# Patient Record
Sex: Male | Born: 1945 | Race: White | Hispanic: No | Marital: Married | Smoking: Former smoker
Health system: Southern US, Community
[De-identification: ages and names within clinical notes are randomized; demographics above are authoritative.]

## PROBLEM LIST (undated history)

## (undated) DIAGNOSIS — I2510 Atherosclerotic heart disease of native coronary artery without angina pectoris: Secondary | ICD-10-CM

## (undated) DIAGNOSIS — E785 Hyperlipidemia, unspecified: Secondary | ICD-10-CM

## (undated) DIAGNOSIS — K08109 Complete loss of teeth, unspecified cause, unspecified class: Secondary | ICD-10-CM

## (undated) DIAGNOSIS — L719 Rosacea, unspecified: Secondary | ICD-10-CM

## (undated) DIAGNOSIS — M1990 Unspecified osteoarthritis, unspecified site: Secondary | ICD-10-CM

## (undated) DIAGNOSIS — J302 Other seasonal allergic rhinitis: Secondary | ICD-10-CM

## (undated) DIAGNOSIS — G609 Hereditary and idiopathic neuropathy, unspecified: Secondary | ICD-10-CM

## (undated) DIAGNOSIS — M795 Residual foreign body in soft tissue: Secondary | ICD-10-CM

## (undated) DIAGNOSIS — Z87442 Personal history of urinary calculi: Secondary | ICD-10-CM

## (undated) DIAGNOSIS — Z973 Presence of spectacles and contact lenses: Secondary | ICD-10-CM

## (undated) DIAGNOSIS — I700 Atherosclerosis of aorta: Secondary | ICD-10-CM

## (undated) DIAGNOSIS — E039 Hypothyroidism, unspecified: Secondary | ICD-10-CM

## (undated) DIAGNOSIS — Z972 Presence of dental prosthetic device (complete) (partial): Secondary | ICD-10-CM

## (undated) HISTORY — PX: TOTAL HIP ARTHROPLASTY: SHX124

## (undated) HISTORY — DX: Unspecified osteoarthritis, unspecified site: M19.90

## (undated) HISTORY — PX: CARDIOVASCULAR STRESS TEST: SHX262

## (undated) HISTORY — PX: LAPAROSCOPIC APPENDECTOMY: SUR753

---

## 1999-09-17 ENCOUNTER — Encounter: Payer: Self-pay | Admitting: Emergency Medicine

## 1999-09-17 ENCOUNTER — Emergency Department (HOSPITAL_COMMUNITY): Admission: EM | Admit: 1999-09-17 | Discharge: 1999-09-17 | Payer: Self-pay | Admitting: Emergency Medicine

## 1999-09-24 ENCOUNTER — Ambulatory Visit (HOSPITAL_COMMUNITY): Admission: RE | Admit: 1999-09-24 | Discharge: 1999-09-24 | Payer: Self-pay | Admitting: Orthopedic Surgery

## 1999-09-24 ENCOUNTER — Encounter: Payer: Self-pay | Admitting: Orthopedic Surgery

## 2002-03-26 ENCOUNTER — Emergency Department (HOSPITAL_COMMUNITY): Admission: EM | Admit: 2002-03-26 | Discharge: 2002-03-27 | Payer: Self-pay | Admitting: Emergency Medicine

## 2002-03-27 ENCOUNTER — Encounter: Payer: Self-pay | Admitting: Emergency Medicine

## 2002-10-07 ENCOUNTER — Ambulatory Visit (HOSPITAL_COMMUNITY): Admission: RE | Admit: 2002-10-07 | Discharge: 2002-10-07 | Payer: Self-pay | Admitting: Gastroenterology

## 2002-10-07 ENCOUNTER — Encounter (INDEPENDENT_AMBULATORY_CARE_PROVIDER_SITE_OTHER): Payer: Self-pay | Admitting: Specialist

## 2004-05-17 ENCOUNTER — Inpatient Hospital Stay (HOSPITAL_COMMUNITY): Admission: RE | Admit: 2004-05-17 | Discharge: 2004-05-20 | Payer: Self-pay | Admitting: Orthopedic Surgery

## 2006-04-04 ENCOUNTER — Inpatient Hospital Stay (HOSPITAL_COMMUNITY): Admission: EM | Admit: 2006-04-04 | Discharge: 2006-04-05 | Payer: Self-pay | Admitting: Emergency Medicine

## 2006-04-04 ENCOUNTER — Encounter (INDEPENDENT_AMBULATORY_CARE_PROVIDER_SITE_OTHER): Payer: Self-pay | Admitting: Specialist

## 2006-08-12 ENCOUNTER — Emergency Department (HOSPITAL_COMMUNITY): Admission: EM | Admit: 2006-08-12 | Discharge: 2006-08-12 | Payer: Self-pay | Admitting: Emergency Medicine

## 2006-08-27 ENCOUNTER — Ambulatory Visit: Payer: Self-pay | Admitting: Cardiovascular Disease

## 2006-09-03 ENCOUNTER — Ambulatory Visit: Payer: Self-pay

## 2008-10-14 ENCOUNTER — Ambulatory Visit: Payer: Self-pay | Admitting: Cardiovascular Disease

## 2008-10-21 ENCOUNTER — Ambulatory Visit: Payer: Self-pay

## 2009-07-06 ENCOUNTER — Telehealth (INDEPENDENT_AMBULATORY_CARE_PROVIDER_SITE_OTHER): Payer: Self-pay | Admitting: *Deleted

## 2010-10-03 ENCOUNTER — Encounter: Admission: RE | Admit: 2010-10-03 | Discharge: 2010-10-03 | Payer: Self-pay | Admitting: Family Medicine

## 2011-05-01 NOTE — Assessment & Plan Note (Signed)
[facility]                            CARDIOLOGY OFFICE NOTE   NAME:Ryan Kerr, Ryan Kerr                   MRN:          [id number]  DATE:10/14/2008                            DOB:          1946/11/08    A 65 year old patient referred for chest pain, hyperlipidemia, and  smoking.   The patient has been seen in our practice back in 2007.  At that time,  he had atypical chest pain and had a normal stress Myoview.   Over the last few weeks, the patient has had continued atypical chest  pain.  The pain is central in his chest.  It does not necessarily  radiate.  It is a dull ache, nothing he does makes it go away quicker,  nothing he does makes it worse, in particular it is nonexertional.  There is no associated diaphoresis.  The pain is intermittent during the  day, in fact while I was sitting and talking to him, he said he had  pain.   The pain is clearly not positional.  It has not been progressive, but it  has been persistent.   His coronary risk factors include hypercholesterolemia and smoking.  He  has been on statin drugs for a while.   I do not have a recent LDL on him.  He smokes about a pack a day.  He  has not tried to quit.  I talked to him at length regarding alternatives  in terms of Wellbutrin, Chantix, nicotine patch.   The patient mostly smokes when he is doing activities outdoors.   The long-term risk in regards to lung cancer and vascular disease were  discussed with him.   His review of systems is otherwise negative and particularly he has not  had signs of reflux or gallbladder disease.   He has no known allergies.   His meds include simvastatin 40 mg a day, an aspirin a day, calcium.   Family history is unremarkable.  He has no allergies.   His past medical history is otherwise remarkable for previous left hip  replacement and appendectomy.   He does drink alcohol possibly in excess.  He has couple caffeinated  beverages a day.   The patient is married.  He has no kids.  He continues to be more  sedentary than previously.  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   He also fought in [place].   PHYSICAL EXAMINATION:  GENERAL:  His exam is remarkable for a large-  chested white male in no distress.  VITAL SIGNS:  His blood pressure 120/82, pulse 80 and regular,  respiratory rate 14, afebrile, his weight has gone up in 2 years from  188-196.  He is in sinus rhythm, rate of 70.  HEENT:  Unremarkable.  NECK:  Carotids normal without bruit.  No lymphadenopathy, thyromegaly,  or JVP elevation.  LUNGS:  Clear, good diaphragmatic motion.  No wheezing.  CARDIAC:  S1-S2 with normal heart sounds.  PMI normal.  Chest is not  tender to touch.  ABDOMEN:  Benign.  Bowel sounds positive.  No AAA, no tenderness, no  bruit, no hepatosplenomegaly, no hepatojugular reflux or tenderness.  EXTREMITIES:  Distal pulse are intact.  No edema.  NEURO:  Nonfocal.  SKIN:  Warm and dry.  MUSCULOSKELETAL:  No weakness.   EKG shows sinus rhythm.  There is nonspecific ST-T wave changes in the  inferior leads.   IMPRESSION:  1. Atypical chest pain, nonspecific EKG changes.  Followup stress      Myoview.  2. Hyperlipidemia.  Continue statin drug.  Lipid and liver profile in      6 months.  3. Smoking cessation.  Discussed with Dr. Caryl Never need to start      Wellbutrin or nicotine patch.   Overall, I think the patient's symptoms are atypical, as long as his  stress Myoview is normal, we will see him on an as-needed basis.     [name]. [name], MD, [facility]  Electronically Signed    PCN/MedQ  DD: 10/14/2008  DT: 10/14/2008  Job #: [id number]   cc:   Ursula Beath, MD  [name], M.D.

## 2011-05-04 NOTE — Discharge Summary (Signed)
NAME:  Ryan Kerr, Ryan Kerr NO.:  [id number]   MEDICAL RECORD NO.:  [id number]                   PATIENT TYPE:  INP   LOCATION:  5001                                 FACILITY:  [facility]   PHYSICIAN:  Dyke Brackett, M.D.                 DATE OF BIRTH:  1946/02/08   DATE OF ADMISSION:  05/17/2004  DATE OF DISCHARGE:  05/20/2004                                 DISCHARGE SUMMARY   ADMISSION DIAGNOSES:  1. 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  2. Hypercholesterolemia.   DISCHARGE DIAGNOSES:  1. Left total hip arthroplasty.  2. Asymptomatic postoperative blood loss anemia.  3. Hypercholesterolemia.   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  Since that time he has had progressively worsening  pain in the hip with loss of range of motion with any type of activity.  He  does describe a sharp stabbing-type pain throughout the lateral aspect of  the hip that radiates down the entire leg, occasionally has popping and  catching.  X-rays reveal a deformed femoral head with bone on bone,  acetabulum with spurring.   ALLERGIES:  No known drug allergies.   CURRENT MEDICATIONS:  Simvastatin 40 mg p.o. daily.   SURGICAL PROCEDURE:  On May 17, 2004, the patient was taken to the OR by Dyke Brackett, M.D., assisted by Richardean Canal, P.A.-C.  Under general  anesthesia the patient underwent a left total hip arthroplasty with a 15 mm  large-stature femoral stem, a 52 mm outside diameter, 32 mm inside diameter  acetabular lining, a size 52 mm 100 Series acetabular cup, a 32 mm +9  femoral ball on an apex hole eliminator.  The patient tolerated the  procedure well.  There were no complications.  The patient was discharged to  the recovery room and then to the orthopedic floor in good condition.   CONSULTATIONS:  The following  routine consults were requested:  Physical  therapy and occupational therapy.   HOSPITAL COURSE:  On May 17, 2004, the patient was admitted to [facility] under the care of Dr. Frederico Hamman.  He underwent a left total  hip arthroplasty without any complications.  The patient tolerated the  procedure well.  The patient was transferred to the recovery room and then  to the orthopedic floor in good condition for a routine total hip protocol  and Lovenox for DVT prophylaxis.  The patient incurred a three-day  postoperative course in which the patient did very well with physical  therapy with vital signs remaining stable.  His leg remained  neuromotorvascularly intact.  His wound remained benign.  He did develop  some postoperative blood loss anemia with his hemoglobin dropping to 9.5,  but the patient otherwise remained asymptomatic  so this was allowed to  resolve on its own.  The patient's pain was initially controlled with IV  pain medications, but transition over to p.o. medications went very well  without any problems, and the patient was felt to be medically and  orthopedically stable, ready for discharge home on postop day #3 in good  condition, with routine home health outpatient protocol.   LABORATORY DATA:  EKG on admission was normal sinus rhythm at 65 beats per  minute.  A CBC done on June 4:  WBC 10.7, hemoglobin 9.7, hematocrit 28.1,  platelets 186.  Routine chemistries on July 3:  Sodium of 133, up from a low  of 130 the day previous, glucose 117, felt to be routine postop stress and  inactivity, would continue to monitor.  BUN 11, creatinine 1.0, calcium of  7.7.  Routine urinalysis on admission was normal.   MEDICATIONS UPON DISCHARGE FROM ORTHOPEDICS FLOOR:  1. Zocor 40 mg p.o. daily.  2. Colace 100 mg p.o. b.i.d.  3. Trinsicon one tablet p.o. t.i.d.  4. Lovenox 40 mg subcu q.24h.  5. Percocet one or two tablets every four to six hours p.r.n. pain.  6. Tylenol  650 mg p.o. q.4h. p.r.n.  7. Skelaxin one or two tablets every six to eight hours p.r.n.  8. Restoril 15 mg p.o. q.h.s. p.r.n.   DISCHARGE INSTRUCTIONS:  1. Medications:  The patient is to resume routine home medications with the     addition of the following:     a. Percocet one or two tablets every four to six hours for pain as        needed.     b. Lovenox 40 mg injection once a day for the next 10 days.  2. Activity as tolerated with the use of a walker.  The patient is to     closely monitor hip precautions.  3. Diet:  No restrictions.  4. Wound care:  The patient is to keep wound clean.  If any concerns of     infections, the patient is to call Dr. Candise Bowens office for advise.  5. Follow-up:  The patient is to call [phone] for a follow-up appointment     with Dr. Madelon Lips in approximately 14 days.   CONDITION ON DISCHARGE TO HOME:  Improved and good.      Jamelle Rushing, Arnetha Courser, M.D.    RWK/MEDQ  D:  06/20/2004  T:  06/20/2004  Job:  [id number]   cc:   [facility]

## 2011-05-04 NOTE — Assessment & Plan Note (Signed)
[name]                              CARDIOLOGY OFFICE NOTE   NAME:Ryan Kerr                   MRN:          [id number]  DATE:08/27/2006                            DOB:          [birth year]    Mr. 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  He is referred for chest pain.  He was seen in  the Emergency Room at [facility] on August 29.  His chest pain has been  going on for a few weeks.   He was seen there and had a normal EKG, ruled out for myocardial infarction,  and was discharged from the ER.  He has not had a stress test.   He has hyperlipidemia and smokes.   Otherwise, his past medical history is fairly unremarkable.   He does not do illicit drugs.  He currently smokes half a pack a day.  He  used to drink quite a bit, but does not drink at all now.  He has one  caffeinated beverage a day.  His pain actually sounds more musculoskeletal  in nature.  He gets a numbness and tingling down his left arm.  He does  occasionally get chest pressure that radiates towards his abdomen.  There is  no history of reflux or ulcer disease.   He has had a previous left hip replacement and appendectomy.   He is retired.  He was in the [employer]	[place].  He was in [place] and stationed  all over the world.  He particularly enjoyed [place] and the [employer].   He is married, does not have kids.  He used to swim, but has not done this  in a month.   He denies any history of cervical spine problems or carpal tunnel.  He used  to work at the post office, and is right-handed.   He takes Lipitor 40 mg a day.   He is not allergic to anything.   REVIEW OF SYSTEMS:  Remarkable for no previous cervical spine problems, no  disk problems, no history of thyroid disease and no previous coronary  disease.   PHYSICAL EXAMINATION:  The blood pressure is 120/70, pulse is 70 and  regular.  LUNGS:   Clear.  CAROTIDS:  Normal.  There is an S1 and S2, normal heart sounds.  ABDOMEN:  Benign.  EXTREMITIES:  Intact pulses, no edema.  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  I did multiple muscular maneuvers to test at his neck and  shoulder girdle.  He had no significant pain or weakness in his upper  extremities, and no evidence of range of motion problems with his cervical  spine.  No loss of pulse with prolonged arm raises.   Baseline EKG is normal.   IMPRESSION:  I do not think that the patient's pain is cardiac in etiology.  He had no EKG changes with pain in the Emergency Room, and ruled out for  myocardial infarction.  I think that he does deserve, given his risk factors  and age, to have a  stress Myoview study.  Apparently some of this pain may  have been precipitated by a friend who had a recent stent.  I think as long  as his stress Myoview is normal he can follow up with Ryan Kerr, and  possibly have further workup for either rotator cuff or cervical spine  problem.   I told him it would be fine for him to return to swimming.  In the meantime  I told him to try anti-inflammatory medicine such as Motrin for the pain.  We will try to get his stress Myoview scheduled as soon as possible.                               Ryan Pick. [name], MD, [facility]    PCN/MedQ  DD:  08/27/2006  DT:  08/28/2006  Job #:  [id number]   cc:   Ryan Peat, M.D.  Ryan Kerr

## 2011-05-04 NOTE — H&P (Signed)
NAME:  Ryan Kerr, Ryan Kerr NO.:  [id number]   MEDICAL RECORD NO.:  [id number]                   PATIENT TYPE:  INP   LOCATION:  NA                                   FACILITY:  [facility]   PHYSICIAN:  Jamelle Rushing, P.A.                DATE OF BIRTH:  1946/09/15   DATE OF ADMISSION:  05/17/2004  DATE OF DISCHARGE:                                HISTORY & PHYSICAL   CHIEF COMPLAINT:  Pain and loss of range of motion of left hip.   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  Since that time, he has been having  progressively worsened pain and loss of range of motion in that hip; it  worsens with any type of activity and the amount of time he stands on his  feet.  He does note a sharp, stabbing-type pain throughout the lateral  aspect of the hip; it does radiate down the entire leg.  He does have  occasional popping and catching.  X-rays reveal a deformed femoral head with  bone-on-bone of the acetabulum with spurring.   ALLERGIES:  No known drug allergies.   CURRENT MEDICATIONS:  Simvastatin 40 mg p.o. daily.   PREVIOUS MEDICAL HISTORY:  Hypercholesterolemia.   PAST SURGICAL HISTORY:  Right knee meniscectomy in 1983 with no  complications.   SOCIAL HISTORY:  The patient is a 65 year old white male, fairly physically  fit-appearing.  He does have a history of smoking about 3/4 of a pack over  the last 37 years on a daily basis.  He does not drink.  He is married; he  lives with his wife in a 1-story house; he is currently retired.   FAMILY PHYSICIAN:  [facility].   FAMILY MEDICAL HISTORY:  Mother is deceased from possible cardiac  complications.  Father is alive with diabetes.  The patient has got 4  brothers alive with no medical issues and 1 sister alive with no medical  complaints.   REVIEW OF  SYSTEMS:  Review of systems is positive for full upper dentures,  partial lower plate.  He does have reading glasses.  Otherwise, all other  review of system categories are negative.   PHYSICAL EXAM:  VITALS:  Height is 5 foot 4 inches.  Weight is 170 pounds.  Blood pressure is 98/62, pulse is 72, respirations are 12, patient is  afebrile.  GENERAL:  This is a short-in-stature, fairly healthy-appearing, physically  fit male.  He is able to ambulate without any significant limp.  He is able  to get himself easily on and off the exam table.  HEENT:  Head was normocephalic.  Pupils equal, round and reactive,  accommodating to light.  Extraocular motions intact.  Sclerae are  nonicteric.  External ears are without deformities.  Gross hearing is  intact.  Nasal septum is midline.  Oral buccal mucosa is pink and moist.  NECK:  Neck was supple with no palpable lymphadenopathy.  Thyroid region was  nontender.  He had good range of motion of his cervical spine without any  difficulty or tenderness.  CHEST:  Lung sounds were clear and equal bilaterally, no wheezes, rales or  rhonchi.  HEART:  Regular rate and rhythm.  ABDOMEN:  Abdomen was soft and nontender.  Bowel sounds were normoactive  throughout.  No hepatosplenomegaly.  EXTREMITIES:  Upper extremities were symmetrically sized and shaped.  He had  good range of motion of shoulders, elbows and wrists.  Motor strength was  5/5.  Lower extremities:  Right and left hips had full extension.  Right  hip, he was able to bring up to 120 degrees, left hip up to 100 degrees.  He, on the left hip, had 0-degrees internal rotation and 5-degrees external.  Right hip had 20-degrees internal and external rotation without any  difficulty.  Bilateral knees were symmetrical with good range of motion, no  instability, no signs of erythema or ecchymosis or effusion.  Calves were  nontender.  Ankles were symmetrical with good dorsi/plantarflexion.  PERIPHERAL  VASCULATURE:  Carotid pulses were 2+, no bruits.  Radial pulses  were 2+.  Dorsalis pedis and posterior tibial pulses were 2+.  He had no  lower extremity edema.  NEUROLOGIC:  The patient was conscious, alert and appropriate, easy  conversation with examiner.  Cranial nerves II-XII were grossly intact.  The  patient was grossly intact to light-touch sensation from head to toe, no  gross neurologic defects noted.  BREAST, RECTAL AND GU EXAMS:  Deferred at this time.   IMPRESSION:  1. Traumatic end-stage osteoarthritis of left hip.  2. Hypercholesterolemia.   PLAN:  The patient will be admitted to [facility] on May 17, 2004  for a planned left total hip arthroplasty by Dr. Lacretia Nicks. Dava Najjar.  The patient  will undergo all routine labs and tests for this procedure.  The patient  does have 2 units of autologous blood for this procedure.                                                Jamelle Rushing, P.A.    RWK/MEDQ  D:  05/10/2004  T:  05/11/2004  Job:  [id number]   cc:   Thera Flake., M.D.  [address] [place]  [place] [address]  Fax: [phone]

## 2011-05-04 NOTE — Op Note (Signed)
NAME:  Ryan Kerr, Ryan Kerr NO.:  [id number]   MEDICAL RECORD NO.:  [id number]                   PATIENT TYPE:  INP   LOCATION:  2899                                 FACILITY:  [facility]   PHYSICIAN:  Thera Flake., M.D.             DATE OF BIRTH:  1946/01/03   DATE OF PROCEDURE:  05/17/2004  DATE OF DISCHARGE:                                 OPERATIVE REPORT   PREOPERATIVE DIAGNOSIS:  Status post work-related injury with  osteoarthritis, left hip.   POSTOPERATIVE DIAGNOSIS:  Status post work-related injury with  osteoarthritis, left hip.   OPERATION:  Left total hip replacement (DePuy Prodigy stem with 32 mm hip  ball, +9 mm neck length, and 52 mm acetabulum).   SURGEON:  Dyke Brackett, M.D.   ASSISTANTChestine Spore, P.A.   ESTIMATED BLOOD LOSS:  Approximately 250.   General anesthetic.   Sterile prep and drape in the lateral position.  A posterior approach to the  hip made with careful identification of the sciatic nerve.  The iliotibial  band was split.  The short external rotators of the hip split, revealing a  moderately enlarged head with severe degenerative change.  The head was cut  about one finger breadth above the lesser trochanter, followed by  progressive reaming and rasping to a 14.5 mm size stem to accept a 15 mm  stem.  The acetabulum was identified and the excess labral tissue was  debrided.  A moderate capsular release made.  Reaming of the acetabulum was  progressively made up to a 51 to accept a 52 mm cup with the appropriate  amount of anteversion and abduction.  Final components were inserted into a  good bleeding bone bed, followed by insertion of a trial poly.  Rasp was  used as a trial with the best restoration of leg length/stability with a  Prodigy stem and a +9 mm neck length.  The final components were inserted,  acetabular cup followed by femur, and again a neck trial done off that and  again confirmed to be a +9 mm neck  length in terms of optimal stability.  The closure was effected on the iliotibial band fascia with running #1  Ethibond, 2-0 Vicryl, and skin clips.  Marcaine with epinephrine was  infiltrated in the skin, a lightly compressive sterile dressing applied.                                               Thera Flake., M.D.    WDC/MEDQ  D:  05/17/2004  T:  05/17/2004  Job:  [id number]

## 2011-05-04 NOTE — Op Note (Signed)
NAME:  Ryan Kerr, Ryan Kerr NO.:  [id number]   MEDICAL RECORD NO.:  [id number]          PATIENT TYPE:  INP   LOCATION:  0098                         FACILITY:  [facility]   PHYSICIAN:  Wilmon Arms. Corliss Skains, M.D. DATE OF BIRTH:  [birth year]   DATE OF PROCEDURE:  04/04/2006  DATE OF DISCHARGE:                                 OPERATIVE REPORT   PREOP DIAGNOSIS:  Acute appendicitis.   POSTOPERATIVE DIAGNOSIS:  Acute appendicitis.   PROCEDURE PERFORMED:  Laparoscopic appendectomy.   SURGEON:  Wilmon Arms. Tsuei, M.D.   ANESTHESIA:  General endotracheal.   INDICATIONS:  The patient is a 65 year old male who presents with several  days of generalized abdominal pain, now localized to his right lower  quadrant. He denies any fever, but he has had a decreased appetite. The  patient did have some fever a couple of days ago. He last ate yesterday. He  went to go see his family physician today; and the patient was sent for CT  scan which showed evidence of right lower quadrant inflammation, consistent  with appendicitis. He was then referred to hospital for surgical evaluation.   DESCRIPTION OF PROCEDURE:  The patient was brought to the operating and  placed in the supine position on the operating table. After an adequate  level of general endotracheal anesthesia was obtained, a Foley catheter was  placed under sterile technique. The patient's abdomen was then shaved,  prepped with Betadine, and draped in a sterile fashion. A transverse  incision was made just above the umbilicus. Dissection was carried down to  the fascia which was opened vertically. A pursestring suture of Vicryl was  placed circumferentially around the fascial opening.   The Hasson cannula was inserted in the peritoneum and was secured with a  stay suture. Pneumoperitoneum was obtained by insufflating CO2 and  maintaining a maximum pressure of 15 mmHg. The laparoscopy was inserted and  no purulence was noted in the  right lower quadrant. A 10-mm port was placed  in the upper midline. A 5-mm port was placed in the left lower quadrant. The  scope was moved to the upper midline port. Two Glassman clamps were inserted  through the other work ports and the cecum was then retracted medially; and  an inflamed nonperforated appendix was immediately encountered. It was  grasped with a Babcock clamp and elevated.  A  Harmonic scalpel was used to  take down the mesoappendix, down to the base of the appendix. An Endo-GIA  stapler was used to transect the base of the appendix. The appendix was  placed in an EndoCatch sac and removed through the umbilical port. We  reinspected the staple line which was noted to be hemostatic.   The right lower quadrant was then thoroughly irrigated. No bleeding was  noted. The appendix was removed through the umbilical incision. The ports  were removed under direct vision as pneumoperitoneum was released. The  patient  was then rolled back to a flat position. The wounds were closed with 4-0  Monocryl. Steri-Strips and clean dressings were applied. The patient was  extubated, and brought to the  recovery room in stable condition. His Foley  catheter was removed prior to leaving the operating room.      Wilmon Arms. Tsuei, M.D.  Electronically Signed     MKT/MEDQ  D:  04/04/2006  T:  04/05/2006  Job:  [id number]

## 2013-03-27 LAB — HM COLONOSCOPY

## 2014-10-08 ENCOUNTER — Emergency Department (HOSPITAL_COMMUNITY): Payer: Federal, State, Local not specified - PPO

## 2014-10-08 ENCOUNTER — Encounter (HOSPITAL_COMMUNITY): Payer: Self-pay | Admitting: Emergency Medicine

## 2014-10-08 ENCOUNTER — Emergency Department (HOSPITAL_COMMUNITY)
Admission: EM | Admit: 2014-10-08 | Discharge: 2014-10-08 | Disposition: A | Discharge status: Home / Self Care | Payer: Federal, State, Local not specified - PPO | Attending: Emergency Medicine | Admitting: Emergency Medicine

## 2014-10-08 DIAGNOSIS — N200 Calculus of kidney: Secondary | ICD-10-CM | POA: Insufficient documentation

## 2014-10-08 DIAGNOSIS — Z7982 Long term (current) use of aspirin: Secondary | ICD-10-CM | POA: Insufficient documentation

## 2014-10-08 DIAGNOSIS — E780 Pure hypercholesterolemia: Secondary | ICD-10-CM | POA: Diagnosis not present

## 2014-10-08 DIAGNOSIS — R300 Dysuria: Secondary | ICD-10-CM | POA: Diagnosis present

## 2014-10-08 DIAGNOSIS — R319 Hematuria, unspecified: Secondary | ICD-10-CM

## 2014-10-08 DIAGNOSIS — Z79899 Other long term (current) drug therapy: Secondary | ICD-10-CM | POA: Insufficient documentation

## 2014-10-08 LAB — URINALYSIS, ROUTINE W REFLEX MICROSCOPIC
BILIRUBIN URINE: NEGATIVE
Glucose, UA: NEGATIVE mg/dL
KETONES UR: 15 mg/dL — AB
Leukocytes, UA: NEGATIVE
NITRITE: NEGATIVE
PH: 6 (ref 5.0–8.0)
Protein, ur: 100 mg/dL — AB
Specific Gravity, Urine: 1.027 (ref 1.005–1.030)
UROBILINOGEN UA: 0.2 mg/dL (ref 0.0–1.0)

## 2014-10-08 LAB — I-STAT CHEM 8, ED
BUN: 29 mg/dL — ABNORMAL HIGH (ref 6–23)
CALCIUM ION: 1.18 mmol/L (ref 1.13–1.30)
CHLORIDE: 101 meq/L (ref 96–112)
Creatinine, Ser: 1.3 mg/dL (ref 0.50–1.35)
GLUCOSE: 110 mg/dL — AB (ref 70–99)
HEMATOCRIT: 50 % (ref 39.0–52.0)
Hemoglobin: 17 g/dL (ref 13.0–17.0)
Potassium: 3.9 mEq/L (ref 3.7–5.3)
Sodium: 136 mEq/L — ABNORMAL LOW (ref 137–147)
TCO2: 22 mmol/L (ref 0–100)

## 2014-10-08 LAB — URINE MICROSCOPIC-ADD ON

## 2014-10-08 MED ORDER — TAMSULOSIN HCL 0.4 MG PO CAPS: 0.4000 mg | ORAL_CAPSULE | Freq: Every day | ORAL | Status: DC

## 2014-10-08 MED ORDER — HYDROCODONE-ACETAMINOPHEN 5-325 MG PO TABS: 1.0000 | ORAL_TABLET | Freq: Four times a day (QID) | ORAL | Status: DC | PRN

## 2014-10-08 NOTE — Discharge Instructions (Signed)

## 2014-10-08 NOTE — ED Notes (Signed)
MD Walden at the bedside. 

## 2014-10-08 NOTE — ED Notes (Signed)
Phlebotomy at the bedside.  Dr. Gwendolyn GrantWalden gives update on plan of care. Patient will have chem 8 resulted, planning for discharge pending renal function.

## 2014-10-08 NOTE — ED Provider Notes (Signed)
CSN: [id number]     Arrival date & time 10/08/14  1643 History   First MD Initiated Contact with Patient 10/08/14 1725     Chief Complaint  Patient presents with  . Abdominal Pain  . Dysuria     (Consider location/radiation/quality/duration/timing/severity/associated sxs/prior Treatment) Patient is a 68 y.o. male presenting with abdominal pain and dysuria. The history is provided by the patient.  Abdominal Pain Pain location:  Suprapubic Pain quality: burning and sharp   Pain radiates to:  Does not radiate Pain severity:  Mild Onset quality:  Gradual Timing:  Constant Progression:  Unchanged Chronicity:  New Context: recent illness (recent UTI, just finished 10-day Cipro course this morning)   Context: not diet changes and not eating   Relieved by:  Nothing Worsened by:  Nothing tried Associated symptoms: dysuria and nausea   Associated symptoms: no chills, no cough, no fever, no shortness of breath and no vomiting   Dysuria Associated symptoms include abdominal pain (suprapubic). Pertinent negatives include no shortness of breath.    Past Medical History  Diagnosis Date  . High cholesterol    History reviewed. No pertinent past surgical history. History reviewed. No pertinent family history. History  Substance Use Topics  . Smoking status: Never Smoker   . Smokeless tobacco: Not on file  . Alcohol Use: Not on file    Review of Systems  Constitutional: Negative for fever and chills.  Respiratory: Negative for cough and shortness of breath.   Gastrointestinal: Positive for nausea and abdominal pain (suprapubic). Negative for vomiting.  Genitourinary: Positive for dysuria.  All other systems reviewed and are negative.     Allergies  Review of patient's allergies indicates no known allergies.  Home Medications   Prior to Admission medications   Medication Sig Start Date End Date Taking? Authorizing Provider  aspirin EC 81 MG tablet Take 81 mg by mouth daily.    Yes Historical Provider, MD  levothyroxine (SYNTHROID, LEVOTHROID) 50 MCG tablet Take 50 mcg by mouth daily before breakfast.   Yes Historical Provider, MD  Multiple Vitamins-Minerals (MULTIVITAMIN PO) Take 1 tablet by mouth daily.   Yes Historical Provider, MD  simvastatin (ZOCOR) 40 MG tablet Take 40 mg by mouth daily.   Yes Historical Provider, MD   BP 153/84  Pulse 77  Temp(Src) 97.5 F (36.4 C)  Resp 21  SpO2 96% Physical Exam  Nursing note and vitals reviewed. Constitutional: He is oriented to person, place, and time. He appears well-developed and well-nourished. No distress.  HENT:  Head: Normocephalic and atraumatic.  Mouth/Throat: Oropharynx is clear and moist. No oropharyngeal exudate.  Eyes: EOM are normal. Pupils are equal, round, and reactive to light.  Neck: Normal range of motion. Neck supple.  Cardiovascular: Normal rate and regular rhythm.  Exam reveals no friction rub.   No murmur heard. Pulmonary/Chest: Effort normal and breath sounds normal. No respiratory distress. He has no wheezes. He has no rales.  Abdominal: Soft. He exhibits no distension. There is tenderness (suprapubic). There is no rebound.  Genitourinary: Rectal exam shows no external hemorrhoid, no mass, no tenderness and anal tone normal. Prostate is not enlarged and not tender.  Musculoskeletal: Normal range of motion. He exhibits no edema.  Neurological: He is alert and oriented to person, place, and time. No cranial nerve deficit. He exhibits normal muscle tone. Coordination normal.  Skin: No rash noted. He is not diaphoretic.    ED Course  Procedures (including critical care time) Labs Review Labs Reviewed  URINE CULTURE  URINALYSIS, ROUTINE W REFLEX MICROSCOPIC    Imaging Review Ct Renal Stone Study  10/08/2014   CLINICAL DATA:  Lower abdominal pain. Pain with urination, hematuria. Finished antibiotics today for UTI.  EXAM: CT ABDOMEN AND PELVIS WITHOUT CONTRAST  TECHNIQUE: Multidetector  CT imaging of the abdomen and pelvis was performed following the standard protocol without IV contrast.  COMPARISON:  None.  FINDINGS: Lung bases are clear.  No effusions.  Heart is normal size.  There is moderate to severe left hydronephrosis and perinephric stranding due to a 7 x 4 mm stone at the left ureterovesical junction. Punctate nonobstructing stone in the midpole of the left kidney. No right renal or ureteral stones. No right hydronephrosis.  Liver, gallbladder, pancreas and adrenals have an unremarkable unenhanced appearance. Scattered splenic calcifications and tiny hypodensities which appear fat density, possibly lipoma is.  Sigmoid diverticulosis. No active diverticulitis. Prior appendectomy. Stomach and small bowel are unremarkable. No free fluid, free air or adenopathy. Aortic calcifications without aneurysm.  Prior left hip replacement. No acute bony abnormality. Degenerative changes in the lumbar spine.  IMPRESSION: Moderate to severe left hydronephrosis and hydroureter with perinephric stranding due to a 7 x 4 mm left UVJ stone.   Electronically Signed   By: Charlett NoseKevin  Dover M.D.   On: 10/08/2014 20:27     EKG Interpretation None      MDM   Final diagnoses:  Hematuria  Kidney stone    52M here with dysuria. Present today. Just finished 10 day course of Cipro for UTI. No UTIs prior to this recent one. Some nausea, no fevers. No pain behind testicles. Has some mild urethral burning also, but not penile discharge. No concern for STDs. Low suprapubic pain on exam, prostate exam uncomfortable for patient, but not obvious prostate tenderness, swelling.  Will re-check urine and bladder scan him. No distended bladder appreciated, but patient is obese. Urine with hematuria. Scan shows 7x4 stone at L UVJ with moderate-severe hydronephrosis. Patient is well appearing, normal kidney function. No UTI. I spoke with Dr. Laverle PatterBorden with Urology who was comfortable with sending patient home, I agree.  Stable for discharge.   I have reviewed all labs and imaging and considered them in my medical decision making.   Elwin MochaBlair Rishan Oyama, MD 10/09/14 0001

## 2014-10-08 NOTE — ED Notes (Signed)
Pt in c/o pain with urination and lower abd pain, recently treated for possible UTI, states he completed his antibiotics this morning and this afternoon dysuria returned

## 2014-10-09 LAB — URINE CULTURE
CULTURE: NO GROWTH
Colony Count: NO GROWTH
Special Requests: NORMAL

## 2015-06-13 ENCOUNTER — Other Ambulatory Visit: Payer: Self-pay

## 2016-07-02 DIAGNOSIS — E785 Hyperlipidemia, unspecified: Secondary | ICD-10-CM | POA: Insufficient documentation

## 2016-07-02 DIAGNOSIS — K219 Gastro-esophageal reflux disease without esophagitis: Secondary | ICD-10-CM | POA: Insufficient documentation

## 2016-07-02 DIAGNOSIS — E669 Obesity, unspecified: Secondary | ICD-10-CM | POA: Insufficient documentation

## 2016-07-02 DIAGNOSIS — E039 Hypothyroidism, unspecified: Secondary | ICD-10-CM | POA: Insufficient documentation

## 2016-07-02 DIAGNOSIS — G629 Polyneuropathy, unspecified: Secondary | ICD-10-CM | POA: Insufficient documentation

## 2017-05-16 ENCOUNTER — Ambulatory Visit
Admission: RE | Admit: 2017-05-16 | Discharge: 2017-05-16 | Disposition: A | Discharge status: Home / Self Care | Payer: Federal, State, Local not specified - PPO | Source: Ambulatory Visit | Attending: Physician Assistant | Admitting: Physician Assistant

## 2017-05-16 ENCOUNTER — Other Ambulatory Visit: Payer: Self-pay | Admitting: Physician Assistant

## 2017-05-16 DIAGNOSIS — K5730 Diverticulosis of large intestine without perforation or abscess without bleeding: Secondary | ICD-10-CM

## 2017-05-16 DIAGNOSIS — R1032 Left lower quadrant pain: Secondary | ICD-10-CM

## 2017-05-16 DIAGNOSIS — K5790 Diverticulosis of intestine, part unspecified, without perforation or abscess without bleeding: Secondary | ICD-10-CM | POA: Insufficient documentation

## 2017-05-16 MED ORDER — IOPAMIDOL (ISOVUE-300) INJECTION 61%
100.0000 mL | Freq: Once | INTRAVENOUS | Status: AC | PRN
  Administered 2017-05-16: 100 mL via INTRAVENOUS

## 2017-07-24 ENCOUNTER — Ambulatory Visit (INDEPENDENT_AMBULATORY_CARE_PROVIDER_SITE_OTHER): Payer: Federal, State, Local not specified - PPO | Admitting: Podiatry

## 2017-07-24 ENCOUNTER — Encounter: Payer: Self-pay | Admitting: Podiatry

## 2017-07-24 VITALS — BP 148/80 | Ht 65.0 in | Wt 210.0 lb

## 2017-07-24 DIAGNOSIS — M216X1 Other acquired deformities of right foot: Secondary | ICD-10-CM

## 2017-07-24 DIAGNOSIS — M25571 Pain in right ankle and joints of right foot: Secondary | ICD-10-CM | POA: Diagnosis not present

## 2017-07-24 DIAGNOSIS — M7751 Other enthesopathy of right foot: Secondary | ICD-10-CM

## 2017-07-24 DIAGNOSIS — M6700 Short Achilles tendon (acquired), unspecified ankle: Secondary | ICD-10-CM

## 2017-07-24 DIAGNOSIS — M216X2 Other acquired deformities of left foot: Secondary | ICD-10-CM

## 2017-07-24 NOTE — Progress Notes (Signed)
SUBJECTIVE: 71 y.o. year old male presents complaining of pain in right ankle. Patient points right lateral Sinus tarsi area being the source of pain. Pain is not present at this time since he had a injection not too long ago by Dr. Elijah Birkom. Stated that he had a torn Posterior Tibialis tendon tear that was diagnosed in 09/16/2015 through MRI report.  [facility]), orthotics, and otc ankle braces.   Seen Dr. Theodosia PalingPetry and had surgical consultation, which he chose not to follow.   REVIEW OF SYSTEMS: A comprehensive review of systems was negative except for: Hypertension.   OBJECTIVE: DERMATOLOGIC EXAMINATION: Normal findings.  VASCULAR EXAMINATION OF LOWER LIMBS: All pedal pulses are palpable with normal pulsation.  Capillary Filling times within 3 seconds in all digits.  No visible edema or erythema noted. No change in temperature at affected area of right lateral ankle and sinus tarsi area.   NEUROLOGIC EXAMINATION OF THE LOWER LIMBS: All epicritic and tactile sensations grossly intact. Sharp and Dull discriminatory sensations at the plantar ball of hallux is intact bilateral.   MUSCULOSKELETAL EXAMINATION: Positive for tight Achilles tendon bilateral, unable to dorsiflex beyond 90 degree with knee joint flexed or extended. Severe elevated first ray with forefoot varus bilateral. Loss of medial longitudinal arch of right foot. Loss of Posterior tibialis function right. Excess STJ pronation of right foot with weight bearing.  ASSESSMENT: Posterior tibialis dysfunction right. STJ hyperpronation right. Sinus tarsitis right. Severe Achilles tendon contracture bilateral. Forefoot varus bilateral R>L.  PLAN: Reviewed clinical findings and available treatment options; NSAIA, injection, orthotics, braces, shoe modification, surgical options such as STJ Arthroereisis and midfoot fusion of the first MCJ. All treatment options were offered  to patient. Reviewed stretch exercise to practice daily for tight Achilles tendon bilateral. Continue with current Orthotic therapy and proper shoe gear. Ankle brace dispensed for right. Return in 1 month to evaluate progress or for further treatment plan.

## 2017-07-24 NOTE — Patient Instructions (Signed)
Seen for pain in right foot at lateral Sinus tarsi area due to dysfunctional Tibialis Posterior tendon, tight Achilles tendon, severe pronation of STJ and elevated first metatarsal bone. Reviewed clinical findings and available treatment options. Reviewed stretch exercise to practice daily for tight Achilles tendon. Ankle support dispensed. Return in one month.

## 2017-08-20 ENCOUNTER — Other Ambulatory Visit: Payer: Self-pay | Admitting: Urology

## 2017-08-21 ENCOUNTER — Ambulatory Visit: Payer: Federal, State, Local not specified - PPO | Admitting: Podiatry

## 2017-08-23 ENCOUNTER — Encounter (HOSPITAL_COMMUNITY): Payer: Self-pay | Admitting: *Deleted

## 2017-08-24 NOTE — Discharge Instructions (Signed)
Lithotripsy, Care After °This sheet gives you information about how to care for yourself after your procedure. Your health care provider may also give you more specific instructions. If you have problems or questions, contact your health care provider. °What can I expect after the procedure? °After the procedure, it is common to have: °· Some blood in your urine. This should only last for a few days. °· Soreness in your back, sides, or upper abdomen for a few days. °· Blotches or bruises on your back where the pressure wave entered the skin. °· Pain, discomfort, or nausea when pieces (fragments) of the kidney stone move through the tube that carries urine from the kidney to the bladder (ureter). Stone fragments may pass soon after the procedure, but they may continue to pass for up to 4-8 weeks. °? If you have severe pain or nausea, contact your health care provider. This may be caused by a large stone that was not broken up, and this may mean that you need more treatment. °· Some pain or discomfort during urination. °· Some pain or discomfort in the lower abdomen or (in men) at the base of the penis. ° °Follow these instructions at home: °Medicines °· Take over-the-counter and prescription medicines only as told by your health care provider. °· If you were prescribed an antibiotic medicine, take it as told by your health care provider. Do not stop taking the antibiotic even if you start to feel better. °· Do not drive for 24 hours if you were given a medicine to help you relax (sedative). °· Do not drive or use heavy machinery while taking prescription pain medicine. °Eating and drinking °· Drink enough water and fluids to keep your urine clear or pale yellow. This helps any remaining pieces of the stone to pass. It can also help prevent new stones from forming. °· Eat plenty of fresh fruits and vegetables. °· Follow instructions from your health care provider about eating and drinking restrictions. You may be  instructed: °? To reduce how much salt (sodium) you eat or drink. Check ingredients and nutrition facts on packaged foods and beverages. °? To reduce how much meat you eat. °· Eat the recommended amount of calcium for your age and gender. Ask your health care provider how much calcium you should have. °General instructions °· Get plenty of rest. °· Most people can resume normal activities 1-2 days after the procedure. Ask your health care provider what activities are safe for you. °· If directed, strain all urine through the strainer that was provided by your health care provider. °? Keep all fragments for your health care provider to see. Any stones that are found may be sent to a medical lab for examination. The stone may be as small as a grain of salt. °· Keep all follow-up visits as told by your health care provider. This is important. °Contact a health care provider if: °· You have pain that is severe or does not get better with medicine. °· You have nausea that is severe or does not go away. °· You have blood in your urine longer than your health care provider told you to expect. °· You have more blood in your urine. °· You have pain during urination that does not go away. °· You urinate more frequently than usual and this does not go away. °· You develop a rash or any other possible signs of an allergic reaction. °Get help right away if: °· You have severe pain in   your back, sides, or upper abdomen. °· You have severe pain while urinating. °· Your urine is very dark red. °· You have blood in your stool (feces). °· You cannot pass any urine at all. °· You feel a strong urge to urinate after emptying your bladder. °· You have a fever or chills. °· You develop shortness of breath, difficulty breathing, or chest pain. °· You have severe nausea that leads to persistent vomiting. °· You faint. °Summary °· After this procedure, it is common to have some pain, discomfort, or nausea when pieces (fragments) of the  kidney stone move through the tube that carries urine from the kidney to the bladder (ureter). If this pain or nausea is severe, however, you should contact your health care provider. °· Most people can resume normal activities 1-2 days after the procedure. Ask your health care provider what activities are safe for you. °· Drink enough water and fluids to keep your urine clear or pale yellow. This helps any remaining pieces of the stone to pass, and it can help prevent new stones from forming. °· If directed, strain your urine and keep all fragments for your health care provider to see. Fragments or stones may be as small as a grain of salt. °· Get help right away if you have severe pain in your back, sides, or upper abdomen or have severe pain while urinating. °This information is not intended to replace advice given to you by your health care provider. Make sure you discuss any questions you have with your health care provider. °Document Released: 12/23/2007 Document Revised: 10/24/2016 Document Reviewed: 10/24/2016 °Elsevier Interactive Patient Education © 2017 [employer] ° °

## 2017-08-24 NOTE — H&P (Signed)
HPI: Ryan Kerr is a 71 year-old male with a left ureteral stone.  06/30/17: Patient with 4.5 mm (width) by 8 mm (length) left proximal ureteral stone. Stone is visible on KUB. He is currently asymptomatic. Denies nausea, vomiting, fevers, chills. He has no flank pain. He is taking Flomax. He does not believe he passed a stone. He has had one stone. It was a 7 mm ston that passed spontaneously. He plans to try passing this stone spontaneously as well.   08/14/17: Patient returns today for follow up. He currently denies any flank pain, fever, dysuria, urgency, or frequency. Overall, he is feeling well. He denies passing a stone since he was seen last.   The problem is on the left side. This is not his first kidney stone. He is not currently having flank pain, back pain, groin pain, nausea, vomiting, fever or chills. Pain is occuring on the left side. He has not caught a stone in his urine strainer since his symptoms began.   He has never had surgical treatment for calculi in the past.     ALLERGIES: No Allergies    MEDICATIONS: Levothyroxine Sodium 50 mcg tablet  Tamsulosin Hcl 0.4 mg capsule, ext release 24 hr 1 capsule PO Daily  Aspirin 81 MG TABS Oral  Lovastatin 40 mg tablet     GU PSH: None   NON-GU PSH: Appendectomy - 2011 Hip Replacement Knee Arthroscopy    GU PMH: Ureteral calculus - 07/03/2017, (Stable, Chronic), Left, No significant movement of left proximal ureteral stone. Pt would like to continue with MET at this time. Will f/u 1 month for OV/KUB w/Dr. Pilar Jarvis. Instructed to RTC if he has any acute issues ie intractable pain, temp >100.5, or vomiting. Will continue with daily Tamsulosin and straining urine. If stone captured bring to office, - 06/04/2017, - 05/21/2017 Testicular pain, unspecified, Testicular pain - 2014      PMH Notes:  2013-10-10 05:44:25 - Note: Lower abdominal pain, left  2010-10-12 09:16:55 - Note: Arthritis   NON-GU PMH: Hyperthyroidism,  Hyperthyroidism - 2014 Personal history of other mental and behavioral disorders, History of depression - 2014 Personal history of other specified conditions, History of heartburn - 2014 Encounter for general adult medical examination without abnormal findings, Encounter for preventive health examination Hypercholesterolemia    FAMILY HISTORY: Arthritis - Mother Cardiac Failure - Father Death In The Family Father - Father Death In The Family Mother - Mother Diabetes - Father, Brother   SOCIAL HISTORY: Marital Status: Married Preferred Language: English; Ethnicity: Not Hispanic Or Latino; Race: White Historical Smoking Status: Patient smoked until 05/17/2013. Has never drank.  Does not drink caffeine.    REVIEW OF SYSTEMS:    GU Review Male:   Patient denies frequent urination, hard to postpone urination, burning/ pain with urination, get up at night to urinate, leakage of urine, stream starts and stops, trouble starting your stream, have to strain to urinate , erection problems, and penile pain.  Gastrointestinal (Upper):   Patient denies nausea, vomiting, and indigestion/ heartburn.  Gastrointestinal (Lower):   Patient denies diarrhea and constipation.  Constitutional:   Patient denies fever, night sweats, weight loss, and fatigue.  Skin:   Patient denies skin rash/ lesion and itching.  Eyes:   Patient denies blurred vision and double vision.  Ears/ Nose/ Throat:   Patient denies sore throat and sinus problems.  Hematologic/Lymphatic:   Patient denies swollen glands and easy bruising.  Cardiovascular:   Patient denies leg swelling and chest pains.  Respiratory:   Patient denies cough and shortness of breath.  Endocrine:   Patient denies excessive thirst.  Musculoskeletal:   Patient denies back pain and joint pain.  Neurological:   Patient denies headaches and dizziness.  Psychologic:   Patient denies depression and anxiety.   VITAL SIGNS:    Weight 210 lb / 95.25 kg  BP 139/80  mmHg  Pulse 74 /min   MULTI-SYSTEM PHYSICAL EXAMINATION:    Constitutional: Obese. No physical deformities. Normally developed. Good grooming.   Respiratory: Normal breath sounds. No labored breathing, no use of accessory muscles.   Cardiovascular: Regular rate and rhythm. No murmur, no gallop. Normal temperature, no swelling, no varicosities.   Lymphatic: No enlargement of neck, axillae, groin.  Skin: No paleness, no jaundice, no cyanosis. No lesion, no ulcer, no rash.  Neurologic / Psychiatric: Oriented to time, oriented to place, oriented to person. No depression, no anxiety, no agitation.  Gastrointestinal: Obese abdomen. No mass, no tenderness, no rigidity.   Musculoskeletal: Spine, ribs, pelvis no bilateral tenderness. Normal gait and station of head and neck.     PAST DATA REVIEWED:  Source Of History:  Patient  Records Review:   Previous Patient Records  Urine Test Review:   Urinalysis, Urine Culture  X-Ray Review: KUB: Reviewed Films.     PROCEDURES:         KUB - [id number]  A single view of the abdomen is obtained. No obvious opacities noted within the confines of the right renal shadow or along the course of the right ureter. Persistence of 7 mm left proximal ureteral stone noted. Bladder appears free of obstruction. Normal bowel gas pattern. Left hip hardware noted.                Urinalysis - 81003 Dipstick Dipstick Cont'd  Color: Yellow Bilirubin: Neg  Appearance: Clear Ketones: Neg  Specific Gravity: 1.015 Blood: Neg  pH: 7.5 Protein: Neg  Glucose: Neg Urobilinogen: 0.2    Nitrites: Neg    Leukocyte Esterase: Neg    Notes:      ASSESSMENT/PLAN:    - Given minimal progression of his stone over the past several months, we discussed ESWL vs. URS. He would prefer to proceed with ESWL.  - For shockwave lithotripsy I described the risks which include arrhythmia, kidney contusion, kidney hemorrhage, need for transfusion, back discomfort, flank ecchymosis, flank  abrasion, inability to break up stone, inability to pass stone fragments, Steinstrasse, infection associated with obstructing stones, need for different surgical procedure and possible need for repeat shockwave lithotripsy.  A urine culture was performed on 08/14/17 and was found to be negative.

## 2017-08-26 ENCOUNTER — Encounter (HOSPITAL_COMMUNITY): Payer: Self-pay | Admitting: General Practice

## 2017-08-26 ENCOUNTER — Ambulatory Visit (HOSPITAL_COMMUNITY)
Admission: RE | Admit: 2017-08-26 | Discharge: 2017-08-26 | Disposition: A | Discharge status: Home / Self Care | Payer: Federal, State, Local not specified - PPO | Source: Ambulatory Visit | Attending: Urology | Admitting: Urology

## 2017-08-26 ENCOUNTER — Ambulatory Visit (HOSPITAL_COMMUNITY): Payer: Federal, State, Local not specified - PPO

## 2017-08-26 ENCOUNTER — Encounter (HOSPITAL_COMMUNITY)
Admission: RE | Disposition: A | Discharge status: Home / Self Care | Payer: Self-pay | Source: Ambulatory Visit | Attending: Urology

## 2017-08-26 DIAGNOSIS — Z79899 Other long term (current) drug therapy: Secondary | ICD-10-CM | POA: Diagnosis not present

## 2017-08-26 DIAGNOSIS — Z87891 Personal history of nicotine dependence: Secondary | ICD-10-CM | POA: Insufficient documentation

## 2017-08-26 DIAGNOSIS — N201 Calculus of ureter: Secondary | ICD-10-CM | POA: Insufficient documentation

## 2017-08-26 DIAGNOSIS — M1990 Unspecified osteoarthritis, unspecified site: Secondary | ICD-10-CM | POA: Diagnosis not present

## 2017-08-26 DIAGNOSIS — Z7982 Long term (current) use of aspirin: Secondary | ICD-10-CM | POA: Insufficient documentation

## 2017-08-26 DIAGNOSIS — E7800 Pure hypercholesterolemia, unspecified: Secondary | ICD-10-CM | POA: Diagnosis not present

## 2017-08-26 HISTORY — DX: Hypothyroidism, unspecified: E03.9

## 2017-08-26 HISTORY — PX: EXTRACORPOREAL SHOCK WAVE LITHOTRIPSY: SHX1557

## 2017-08-26 HISTORY — DX: Personal history of urinary calculi: Z87.442

## 2017-08-26 SURGERY — LITHOTRIPSY, ESWL
Anesthesia: LOCAL | Laterality: Left

## 2017-08-26 MED ORDER — DIPHENHYDRAMINE HCL 25 MG PO CAPS
25.0000 mg | ORAL_CAPSULE | ORAL | Status: AC
  Administered 2017-08-26: 25 mg via ORAL
  Filled 2017-08-26: qty 1

## 2017-08-26 MED ORDER — OXYCODONE HCL 10 MG PO TABS: 10.0000 mg | ORAL_TABLET | ORAL | 0 refills | Status: DC | PRN

## 2017-08-26 MED ORDER — CIPROFLOXACIN HCL 500 MG PO TABS
500.0000 mg | ORAL_TABLET | ORAL | Status: AC
  Administered 2017-08-26: 500 mg via ORAL
  Filled 2017-08-26: qty 1

## 2017-08-26 MED ORDER — SODIUM CHLORIDE 0.9 % IV SOLN
INTRAVENOUS | Status: DC
  Administered 2017-08-26: 08:00:00 via INTRAVENOUS

## 2017-08-26 MED ORDER — DIAZEPAM 5 MG PO TABS
10.0000 mg | ORAL_TABLET | ORAL | Status: AC
  Administered 2017-08-26: 10 mg via ORAL
  Filled 2017-08-26: qty 2

## 2017-08-26 NOTE — Op Note (Signed)
See [facility] OP note scanned into chart. Also because of the size, density, location and other factors that cannot be anticipated I feel this will likely be a staged procedure. This fact supersedes any indication in the scanned [facility] operative note to the contrary.  

## 2018-01-08 ENCOUNTER — Encounter: Payer: Self-pay | Admitting: Physician Assistant

## 2018-01-08 ENCOUNTER — Ambulatory Visit: Payer: Federal, State, Local not specified - PPO | Admitting: Physician Assistant

## 2018-01-08 ENCOUNTER — Other Ambulatory Visit: Payer: Self-pay

## 2018-01-08 VITALS — BP 124/84 | HR 72 | Temp 97.6°F | Resp 14 | Ht 65.0 in | Wt 218.0 lb

## 2018-01-08 DIAGNOSIS — R05 Cough: Secondary | ICD-10-CM

## 2018-01-08 DIAGNOSIS — Z122 Encounter for screening for malignant neoplasm of respiratory organs: Secondary | ICD-10-CM | POA: Diagnosis not present

## 2018-01-08 DIAGNOSIS — J310 Chronic rhinitis: Secondary | ICD-10-CM | POA: Diagnosis not present

## 2018-01-08 DIAGNOSIS — R053 Chronic cough: Secondary | ICD-10-CM

## 2018-01-08 MED ORDER — FLUTICASONE PROPIONATE 50 MCG/ACT NA SUSP: 2.0000 | Freq: Every day | NASAL | 1 refills | Status: DC

## 2018-01-08 NOTE — Progress Notes (Signed)
Patient presents to clinic today c/o dry cough that has been present for several weeks. Was initially seen at the [facility] and diagnosed with bronchitis, treated with antibiotic. At that time cough was productive of green-brown sputum. X-ray revealed some potential atelectasis. States antibiotic cleared the cough. Current cough is different to him. Again is dry and associated with chronic post-nasal drip and itchy, watery eyes. Denies rash. Denies SOB or wheezing. Endorses 40+ pack-year smoking history. Has never had lung cancer screening but wants to discuss.   Past Medical History:  Diagnosis Date  . High cholesterol   . History of kidney stones   . Hypothyroidism     Current Outpatient Medications on File Prior to Visit  Medication Sig Dispense Refill  . aspirin EC 81 MG tablet Take 81 mg by mouth daily.    . Garlic 1000 MG CAPS Take 1 capsule by mouth daily.    Marland Kitchen. levothyroxine (SYNTHROID, LEVOTHROID) 50 MCG tablet Take 50 mcg by mouth daily before breakfast.    . simvastatin (ZOCOR) 40 MG tablet Take 40 mg by mouth daily.    Marland Kitchen. HYDROcodone-acetaminophen (NORCO/VICODIN) 5-325 MG per tablet Take 1 tablet by mouth every 6 (six) hours as needed for moderate pain. (Patient not taking: Reported on 01/08/2018) 20 tablet 0  . Oxycodone HCl 10 MG TABS Take 1 tablet (10 mg total) by mouth every 4 (four) hours as needed. (Patient not taking: Reported on 01/08/2018) 20 tablet 0   No current facility-administered medications on file prior to visit.     Allergies  Allergen Reactions  . Fexofenadine Other (See Comments)    unknown    History reviewed. No pertinent family history.  Social History   Socioeconomic History  . Marital status: Married    Spouse name: None  . Number of children: None  . Years of education: None  . Highest education level: None  Social Needs  . Financial resource strain: None  . Food insecurity - worry: None  . Food insecurity - inability: None  . Transportation  needs - medical: None  . Transportation needs - non-medical: None  Occupational History  . None  Tobacco Use  . Smoking status: Former Smoker    Packs/day: 2.00    Types: Cigarettes, Pipe    Last attempt to quit: 2012    Years since quitting: 7.0  . Smokeless tobacco: Never Used  Substance and Sexual Activity  . Alcohol use: No  . Drug use: No  . Sexual activity: None  Other Topics Concern  . None  Social History Narrative  . None   Review of Systems - See HPI.  All other ROS are negative.  BP 124/84   Pulse 72   Temp 97.6 F (36.4 C) (Oral)   Resp 14   Ht 5\' 5"  (1.651 m)   Wt 218 lb (98.9 kg)   SpO2 97%   BMI 36.28 kg/m   Physical Exam  Constitutional: He is oriented to person, place, and time and well-developed, well-nourished, and in no distress.  HENT:  Head: Normocephalic and atraumatic.  Eyes: Conjunctivae are normal.  Neck: Neck supple.  Cardiovascular: Normal rate, regular rhythm, normal heart sounds and intact distal pulses.  Pulmonary/Chest: Effort normal and breath sounds normal. No respiratory distress. He has no wheezes. He has no rales. He exhibits no tenderness.  Neurological: He is alert and oriented to person, place, and time.  Skin: Skin is warm and dry. No rash noted.  Psychiatric: Affect normal.  Vitals reviewed.  Assessment/Plan: 1. Chronic rhinitis 2. Chronic cough Start Flonase and saline nasal rinses. OTC cough medications reviewed. Recommend OTC Opcon A for eye allergy symptoms.   3. Encounter for screening for malignant neoplasm of respiratory organs Candidate for lung cancer screening giving history. Will check low-dose CT. - CT CHEST LUNG CA SCREEN LOW DOSE W/O CM; Future  Follow-up 2 weeks.  Piedad Climes, PA-C

## 2018-01-08 NOTE — Patient Instructions (Signed)
Please keep well hydrated. Start the Flonase nasal spray each morning as directed. Keep up with saline nasal flushes -- try doing in the evening as you are getting a lot of congestion due to post-nasal drainage at night. Get some Opcon A allergy drops for your eyes over the counter.  We ware placing an order for a lung CT for lung cancer screening giving your smoking history.  Please follow-up with me in 2 weeks for reassessment.

## 2018-01-15 ENCOUNTER — Ambulatory Visit
Admission: RE | Admit: 2018-01-15 | Discharge: 2018-01-15 | Disposition: A | Discharge status: Home / Self Care | Payer: Federal, State, Local not specified - PPO | Source: Ambulatory Visit | Attending: Physician Assistant | Admitting: Physician Assistant

## 2018-01-15 DIAGNOSIS — Z122 Encounter for screening for malignant neoplasm of respiratory organs: Secondary | ICD-10-CM

## 2018-01-16 ENCOUNTER — Other Ambulatory Visit: Payer: Self-pay | Admitting: Physician Assistant

## 2018-01-16 ENCOUNTER — Telehealth: Payer: Self-pay | Admitting: Family Medicine

## 2018-01-16 NOTE — Telephone Encounter (Signed)
Noted  

## 2018-01-16 NOTE — Telephone Encounter (Signed)
Pt dropped off a copy of a cholesterol test, as requested when he spoke to Patina M. Placed in pcp bin up front.

## 2018-01-17 ENCOUNTER — Telehealth: Payer: Self-pay | Admitting: Family Medicine

## 2018-01-17 DIAGNOSIS — I25119 Atherosclerotic heart disease of native coronary artery with unspecified angina pectoris: Secondary | ICD-10-CM

## 2018-01-17 NOTE — Telephone Encounter (Addendum)
Nuclear stress test has been ordered.

## 2018-01-17 NOTE — Telephone Encounter (Signed)
Copied from CRM [id number]. Topic: Referral - Request >> Jan 17, 2018 10:39 AM Floria RavelingStovall, Shana A wrote: Reason for CRM:  pt called in and said that he thought he was going to be referred to Cardio [facility]  He would like to be referred  as soon as possible   Best number [phone]

## 2018-01-20 ENCOUNTER — Telehealth (HOSPITAL_COMMUNITY): Payer: Self-pay | Admitting: *Deleted

## 2018-01-20 NOTE — Telephone Encounter (Signed)
Patient given detailed instructions per Myocardial Perfusion Study Information Sheet for the test on 01/21/18 at 0745. Patient notified to arrive 15 minutes early and that it is imperative to arrive on time for appointment to keep from having the test rescheduled.  If you need to cancel or reschedule your appointment, please call the office within 24 hours of your appointment. . Patient verbalized understanding.Ryan Kerr     

## 2018-01-21 ENCOUNTER — Ambulatory Visit (HOSPITAL_COMMUNITY): Payer: Federal, State, Local not specified - PPO | Attending: Cardiovascular Disease

## 2018-01-21 DIAGNOSIS — I25119 Atherosclerotic heart disease of native coronary artery with unspecified angina pectoris: Secondary | ICD-10-CM | POA: Diagnosis present

## 2018-01-21 LAB — MYOCARDIAL PERFUSION IMAGING
CHL CUP NUCLEAR SDS: 1
CSEPPHR: 89 {beats}/min
LHR: 0.31
LV dias vol: 96 mL (ref 62–150)
LVSYSVOL: 42 mL
Rest HR: 68 {beats}/min
SRS: 5
SSS: 6
TID: 1.07

## 2018-01-21 MED ORDER — TECHNETIUM TC 99M TETROFOSMIN IV KIT
10.6000 | PACK | Freq: Once | INTRAVENOUS | Status: AC | PRN
  Administered 2018-01-21: 10.6 via INTRAVENOUS
  Filled 2018-01-21: qty 11

## 2018-01-21 MED ORDER — TECHNETIUM TC 99M TETROFOSMIN IV KIT
30.6000 | PACK | Freq: Once | INTRAVENOUS | Status: AC | PRN
  Administered 2018-01-21: 30.6 via INTRAVENOUS
  Filled 2018-01-21: qty 31

## 2018-01-21 MED ORDER — REGADENOSON 0.4 MG/5ML IV SOLN
0.4000 mg | Freq: Once | INTRAVENOUS | Status: AC
  Administered 2018-01-21: 0.4 mg via INTRAVENOUS

## 2018-01-22 ENCOUNTER — Other Ambulatory Visit: Payer: Self-pay | Admitting: Physician Assistant

## 2018-01-22 ENCOUNTER — Telehealth: Payer: Self-pay | Admitting: Emergency Medicine

## 2018-01-22 DIAGNOSIS — I2510 Atherosclerotic heart disease of native coronary artery without angina pectoris: Secondary | ICD-10-CM

## 2018-01-22 DIAGNOSIS — I700 Atherosclerosis of aorta: Secondary | ICD-10-CM

## 2018-01-22 NOTE — Telephone Encounter (Signed)
Referral placed for Cardiology 

## 2018-01-22 NOTE — Telephone Encounter (Signed)
Spoke with patient about his stress test results. Patient states he received his results at 7:30 last night on My chart. He still has concerns about the aortic atherosclerosis. Advised since he has concerns we can refer to Cardiology. Patient states since it took us this long to notify him of his results he will take his time about deciding if he wants the referral to Cardiology or not. He states he was going back to his previous PCP.

## 2018-01-22 NOTE — Telephone Encounter (Signed)
I did not comment on result until 8 AM this morning, when I saw the result. I am not sure if the performing provider released results to MyChart or not. I responded as quickly as I could being I did not see results until this morning. If a < 1 day turn around from getting the result to informing him of result and next steps is too long of a wait, with us treating sick people in clinic from 8am - 5pm then he is welcome to see his previous PCP. I will forward this to Dr. Beverely Lowabori who was to be patient's new PCP as I have seen him once for an acute as a courtesy prior to his establish care appointment with her.

## 2018-01-22 NOTE — Telephone Encounter (Signed)
Pt stopped by office to state that he would like the referral to Cardiology, Pt did not seem upset when discussing conversation from earlier. I did state to give the Cardiologist office a week to contact him for an appt.

## 2018-01-31 ENCOUNTER — Encounter: Payer: Self-pay | Admitting: *Deleted

## 2018-02-06 NOTE — Progress Notes (Signed)
Cardiology Office Note   Date:  02/10/2018   ID:  Ryan JohnCharles E Whittinghill, DOB 06/24/1946, MRN [id number]  PCP:  Sheliah Hatchabori, Katherine E, MD  Cardiologist:   Ahmad Vanwey [place], MD   Chief Complaint  Patient presents with  . New Patient (Initial Visit)      History of Present Illness: Ryan Kerr is a 72 y.o. male who is seen at the request of Dr. Beverely Lowabori for evaluation of atherosclerosis of the aorta and coronary calcification. He has a history of HLD and hypothyroidism. He recently underwent a chest CT for cancer screening. It was noted that he had 3 vessel coronary calcification as well as aortic atherosclerosis. Subsequent Myoview study was normal. He does have a history of hyperlipidemia that is treated. He is overweight. Doesn't exercise a lot due to orthopedic issues. Family history positive for DM.     Past Medical History:  Diagnosis Date  . High cholesterol   . History of kidney stones   . Hypothyroidism     Past Surgical History:  Procedure Laterality Date  . APPENDECTOMY    . EXTRACORPOREAL SHOCK WAVE LITHOTRIPSY Left 08/26/2017   Procedure: LEFT EXTRACORPOREAL SHOCK WAVE LITHOTRIPSY (ESWL);  Surgeon: Ihor Gullyttelin, Mark, MD;  Location: [facility];  Service: Urology;  Laterality: Left;  . JOINT REPLACEMENT Left 2005   hip     Current Outpatient Medications  Medication Sig Dispense Refill  . aspirin EC 81 MG tablet Take 81 mg by mouth daily.    . fluticasone (FLONASE) 50 MCG/ACT nasal spray Place 2 sprays into both nostrils daily. 16 g 1  . Garlic 1000 MG CAPS Take 1 capsule by mouth daily.    Marland Kitchen. levothyroxine (SYNTHROID, LEVOTHROID) 50 MCG tablet Take 50 mcg by mouth daily before breakfast.    . simvastatin (ZOCOR) 40 MG tablet Take 40 mg by mouth daily.     No current facility-administered medications for this visit.     Allergies:   Fexofenadine    Social History:  The patient  reports that he quit smoking about 7 years ago. His smoking use included cigarettes and pipe.  He smoked 2.00 packs per day. he has never used smokeless tobacco. He reports that he does not drink alcohol or use drugs.   Family History:  The patient's family history includes Diabetes in his brother, brother, father, mother, and sister.    ROS:  Please see the history of present illness.   Otherwise, review of systems are positive for none.   All other systems are reviewed and negative.    PHYSICAL EXAM: VS:  BP 124/74   Pulse 68   Ht 5\' 5"  (1.651 m)   Wt 216 lb 9.6 oz (98.2 kg)   BMI 36.04 kg/m  , BMI Body mass index is 36.04 kg/m. GEN: Well nourished, well developed, in no acute distress  HEENT: normal  Neck: no JVD, carotid bruits, or masses Cardiac: RRR; no murmurs, rubs, or gallops,no edema  Respiratory:  clear to auscultation bilaterally, normal work of breathing GI: soft, nontender, nondistended, + BS MS: no deformity or atrophy  Skin: warm and dry, no rash Neuro:  Strength and sensation are intact Psych: euthymic mood, full affect   EKG:  EKG is ordered today. The ekg ordered today demonstrates NSR with normal Ecg. I have personally reviewed and interpreted this study.    Recent Labs: No results found for requested labs within last 8760 hours.    Lipid Panel No results found for: CHOL,  TRIG, HDL, CHOLHDL, VLDL, LDLCALC, LDLDIRECT    Wt Readings from Last 3 Encounters:  02/10/18 216 lb 9.6 oz (98.2 kg)  01/21/18 218 lb (98.9 kg)  01/08/18 218 lb (98.9 kg)      Other studies Reviewed: Additional studies/ records that were reviewed today include:   Lipid levels from last year. Cholesterol 158, triglycerides 75, HDL 63, LDL 80.   Myoview 01/21/18: Study Highlights     Nuclear stress EF: 57%.  There was no ST segment deviation noted during stress.  The study is normal.  This is a low risk study.  The left ventricular ejection fraction is normal (55-65%).   Normal pharmacologic nuclear stress test with no evidence for prior infarct or  ischemia. Normal LVEF.    EXAM: CT CHEST WITHOUT CONTRAST LOW-DOSE FOR LUNG CANCER SCREENING  TECHNIQUE: Multidetector CT imaging of the chest was performed following the standard protocol without IV contrast.  COMPARISON:  None.  FINDINGS: Cardiovascular: Aortic atherosclerosis. Calcification within the LAD and left circumflex and RCA coronary arteries. No pericardial effusion.  Mediastinum/Nodes: No enlarged mediastinal, hilar, or axillary lymph nodes. Thyroid gland, trachea, and esophagus demonstrate no significant findings.  Lungs/Pleura: No pleural effusion. Mild changes of emphysema. Diffuse bronchial wall thickening noted. The perifissural nodule along the oblique fissure has an equivalent diameter 4.3 mm. The there is a subpleural nodule within the medial left apex with an equivalent diameter of 5.7 mm. Lingular and right middle lobe parenchymal scarring.  Upper Abdomen: No acute abnormality.  Musculoskeletal: No chest wall mass or suspicious bone lesions identified.  IMPRESSION: 1. Lung-RADS 2, benign appearance or behavior. Continue annual screening with low-dose chest CT without contrast in 12 months. 2. Aortic Atherosclerosis (ICD10-I70.0) and Emphysema (ICD10-J43.9). 3. LAD, left circumflex and RCA coronary arteries.   Electronically Signed   By: Signa Kell M.D.   On: 01/15/2018 17:07    ASSESSMENT AND PLAN:  1.  Coronary calcification of the coronary arteries noted incidentally on CT. No symptoms. Stress myoview is normal. I reassured the patient concerning these findings. Recommend risk facto modification with ASA and statin. Needs to focus more on lifestyle modification with regular aerobic exercise, weight loss. At this point no further cardiac work up indicated. I will see PRN.     Signed, Scout Gumbs [place], MD  02/10/2018 10:15 AM    [facility] [address], [place], [place], [address] Phone [phone],  Fax [phone]

## 2018-02-10 ENCOUNTER — Ambulatory Visit: Payer: Federal, State, Local not specified - PPO | Admitting: Cardiology

## 2018-02-10 ENCOUNTER — Encounter: Payer: Self-pay | Admitting: Cardiology

## 2018-02-10 VITALS — BP 124/74 | HR 68 | Ht 65.0 in | Wt 216.6 lb

## 2018-02-10 DIAGNOSIS — I2510 Atherosclerotic heart disease of native coronary artery without angina pectoris: Secondary | ICD-10-CM

## 2018-02-10 DIAGNOSIS — E7800 Pure hypercholesterolemia, unspecified: Secondary | ICD-10-CM | POA: Diagnosis not present

## 2018-02-10 NOTE — Patient Instructions (Signed)
Continue to focus on lifestyle modification with weight loss, regular aerobic exercise.  Continue your current medication

## 2018-02-27 LAB — HEPATIC FUNCTION PANEL
ALT: 33 (ref 10–40)
AST: 24 (ref 14–40)
Alkaline Phosphatase: 82 (ref 25–125)
Bilirubin, Total: 0.8

## 2018-02-27 LAB — BASIC METABOLIC PANEL
BUN: 14 (ref 4–21)
Creatinine: 1.2 (ref 0.6–1.3)
GLUCOSE: 88
Potassium: 4 (ref 3.4–5.3)
SODIUM: 139 (ref 137–147)

## 2018-02-27 LAB — LIPID PANEL
Cholesterol: 174 (ref 0–200)
HDL: 40 (ref 35–70)
LDL CALC: 104
TRIGLYCERIDES: 152 (ref 40–160)

## 2018-03-27 ENCOUNTER — Ambulatory Visit: Payer: Federal, State, Local not specified - PPO | Admitting: Family Medicine

## 2018-03-27 ENCOUNTER — Encounter: Payer: Self-pay | Admitting: Family Medicine

## 2018-03-27 ENCOUNTER — Other Ambulatory Visit: Payer: Self-pay

## 2018-03-27 VITALS — BP 124/78 | HR 70 | Resp 16 | Ht 65.0 in | Wt 219.5 lb

## 2018-03-27 DIAGNOSIS — M799 Soft tissue disorder, unspecified: Secondary | ICD-10-CM | POA: Diagnosis not present

## 2018-03-27 DIAGNOSIS — E6609 Other obesity due to excess calories: Secondary | ICD-10-CM | POA: Diagnosis not present

## 2018-03-27 DIAGNOSIS — E785 Hyperlipidemia, unspecified: Secondary | ICD-10-CM

## 2018-03-27 DIAGNOSIS — E039 Hypothyroidism, unspecified: Secondary | ICD-10-CM | POA: Diagnosis not present

## 2018-03-27 DIAGNOSIS — Z6836 Body mass index (BMI) 36.0-36.9, adult: Secondary | ICD-10-CM | POA: Diagnosis not present

## 2018-03-27 DIAGNOSIS — M7989 Other specified soft tissue disorders: Secondary | ICD-10-CM

## 2018-03-27 NOTE — Assessment & Plan Note (Signed)
Ongoing issue for pt.  Stressed need for healthy diet and regular exercise.  Check labs to risk stratify.  Will follow 

## 2018-03-27 NOTE — Assessment & Plan Note (Signed)
New to provider, ongoing for pt.  Reviewed recent labs from [facility]- WNL.  No med changes at this time

## 2018-03-27 NOTE — Progress Notes (Signed)
   Subjective:    Patient ID: Ryan Kerr, male    DOB: 08/17/1946, 72 y.o.   MRN: [id number]  HPI New to establish.  Also sees the [facility] who manages most of his chronic conditions  GI- [facility]- [place]  Hyperlipidemia- chronic problem.  This is being followed by the [facility].  Most recent LDL 104.  They switched his Simvastatin to Atorvastatin to improve his control.  Denies abd pain, N/V, myalgias  Hypothyroid- chronic problem, followed by [facility].  Recent TSH WNL on Levothyroxine 50mcg daily.    Obesity- ongoing issue for pt.  BMI is 36.5.  No regular exercise.  Pt wants to return to swimming but states he 'always finds a reason not to'.  Lump in neck- pt noticed area while shaving 1 week ago.  No TTP, no redness, no drainage.  Soft tissue mass is 'in my gullet'.   Review of Systems For ROS see HPI     Objective:   Physical Exam  Constitutional: He is oriented to person, place, and time. He appears well-developed and well-nourished. No distress.  HENT:  Head: Normocephalic and atraumatic.  Eyes: Pupils are equal, round, and reactive to light. Conjunctivae and EOM are normal.  Neck: Normal range of motion. Neck supple. No thyromegaly present.  Soft tissue mass in loose skin fold under chin, no TTP  Cardiovascular: Normal rate, regular rhythm, normal heart sounds and intact distal pulses.  No murmur heard. Pulmonary/Chest: Effort normal and breath sounds normal. No respiratory distress.  Abdominal: Soft. Bowel sounds are normal. He exhibits no distension.  Musculoskeletal: He exhibits no edema.  Lymphadenopathy:    He has no cervical adenopathy.  Neurological: He is alert and oriented to person, place, and time. No cranial nerve deficit.  Skin: Skin is warm and dry.  Psychiatric: He has a normal mood and affect. His behavior is normal.  Vitals reviewed.         Assessment & Plan:  Soft tissue mass- new.  Pt has small, freely mobile, nontender soft tissue mass in loose skin  under chin.  Will get US to assess.  Pt expressed understanding and is in agreement w/ plan.

## 2018-03-27 NOTE — Assessment & Plan Note (Signed)
New to provider, ongoing for pt.  Tolerating recent change in statin.  Stressed need for healthy diet and regular exercise.  Will follow.

## 2018-03-27 NOTE — Patient Instructions (Signed)
Follow up in September/October to recheck cholesterol and thyroid (I'll do this in the fall and let the [facility] do your physical in the spring) We'll call you with your ultrasound appt and then the results Continue to work on healthy diet and regular exercise- you can do it! Call with any questions or concerns Welcome!  We're glad to have you!

## 2018-03-30 ENCOUNTER — Inpatient Hospital Stay (HOSPITAL_BASED_OUTPATIENT_CLINIC_OR_DEPARTMENT_OTHER): Admission: RE | Admit: 2018-03-30 | Payer: Federal, State, Local not specified - PPO | Source: Ambulatory Visit

## 2018-03-30 ENCOUNTER — Ambulatory Visit (HOSPITAL_BASED_OUTPATIENT_CLINIC_OR_DEPARTMENT_OTHER)
Admission: RE | Admit: 2018-03-30 | Discharge: 2018-03-30 | Disposition: A | Discharge status: Home / Self Care | Payer: Federal, State, Local not specified - PPO | Source: Ambulatory Visit | Attending: Family Medicine | Admitting: Family Medicine

## 2018-03-30 DIAGNOSIS — M799 Soft tissue disorder, unspecified: Secondary | ICD-10-CM | POA: Diagnosis present

## 2018-03-30 DIAGNOSIS — M7989 Other specified soft tissue disorders: Secondary | ICD-10-CM

## 2018-07-16 ENCOUNTER — Ambulatory Visit (INDEPENDENT_AMBULATORY_CARE_PROVIDER_SITE_OTHER): Payer: Federal, State, Local not specified - PPO

## 2018-07-16 ENCOUNTER — Other Ambulatory Visit: Payer: Self-pay | Admitting: Podiatry

## 2018-07-16 ENCOUNTER — Ambulatory Visit (INDEPENDENT_AMBULATORY_CARE_PROVIDER_SITE_OTHER): Payer: Federal, State, Local not specified - PPO | Admitting: Podiatry

## 2018-07-16 DIAGNOSIS — M25571 Pain in right ankle and joints of right foot: Secondary | ICD-10-CM

## 2018-07-16 DIAGNOSIS — M7751 Other enthesopathy of right foot: Secondary | ICD-10-CM

## 2018-07-16 DIAGNOSIS — M659 Synovitis and tenosynovitis, unspecified: Secondary | ICD-10-CM | POA: Diagnosis not present

## 2018-07-16 DIAGNOSIS — M779 Enthesopathy, unspecified: Secondary | ICD-10-CM

## 2018-07-16 DIAGNOSIS — M76821 Posterior tibial tendinitis, right leg: Secondary | ICD-10-CM

## 2018-07-16 MED ORDER — MELOXICAM 15 MG PO TABS: 15.0000 mg | ORAL_TABLET | Freq: Every day | ORAL | 1 refills | Status: AC

## 2018-07-21 LAB — LIPID PANEL
CHOLESTEROL: 150 (ref 0–200)
HDL: 46 (ref 35–70)
LDL Cholesterol: 80
Triglycerides: 119 (ref 40–160)

## 2018-07-21 LAB — BASIC METABOLIC PANEL: Creatinine: 1.1 (ref 0.6–1.3)

## 2018-07-22 NOTE — Progress Notes (Signed)
    HPI: 72 year old male presenting today as a new patient with a chief complaint of pain to the posterior tibial tendon of the right foot that began about one year ago. He was treated with an injection by Dr. Elijah Birkom and states it helped alleviate the symptoms for several months. He had an MRI done on 05/20/18. There are no modifying factors noted. Patient is here for further evaluation and treatment.   Past Medical History:  Diagnosis Date  . Arthritis   . High cholesterol   . History of chicken pox   . History of kidney stones   . Hypertension   . Hypothyroidism        Physical Exam: General: The patient is alert and oriented x3 in no acute distress.  Dermatology: Skin is warm, dry and supple bilateral lower extremities. Negative for open lesions or macerations.  Vascular: Palpable pedal pulses bilaterally. No edema or erythema noted. Capillary refill within normal limits.  Neurological: Epicritic and protective threshold grossly intact bilaterally.   Musculoskeletal Exam: Pain on palpation noted to the posterior tibial tendon of the right foot as well as to the medial, lateral and anterior aspects of the right ankle. Range of motion within normal limits. Muscle strength 5/5 in all muscle groups bilateral lower extremities.  MRI Impression: There is thickening and likely scarring of the deltoid ligament consistent with piror/remote injury. There is severe tendinosis of the distal posterior tibialis tendon with portions of the tendon proximally appearing diminutive concerning for partial tearing. The flexor digitorum longus and flexor hallucis longus tendons are unremarkable.   1. No acute fracture or traumatic malalignment. Degenerative changes as detailed above.   2. 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  3. Severe tendinosis and areas of partial thickness tear of the  posterior tibialis tendon as above.   Assessment: 1. Posterior tibial tendinitis/tendinosis right  2. Right ankle synovitis    Plan of Care:  1. Patient was evaluated. Radiographs were reviewed today. 2. Injection of 0.5 mL Celestone Soluspan injected into the posterior tibial tendon sheath.  3. Injection of 0.5 mL Celestone Soluspan injected into the right ankle joint.  4. Prescription of Meloxicam provided to patient.  5. Ankle brace dispensed.  6. Return to clinic in 4 weeks.    Felecia ShellingBrent M. Evans, DPM [facility]  Dr. Felecia ShellingBrent M. Evans, DPM    [address]                                        [place], [place] [address]                Office [phone]  Fax [phone]

## 2018-08-12 ENCOUNTER — Telehealth: Payer: Self-pay | Admitting: *Deleted

## 2018-08-12 NOTE — Telephone Encounter (Signed)
   [facility] Pre-operative Risk Assessment    Request for surgical clearance:  1. What type of surgery is being performed? R total hip   2. When is this surgery scheduled? 11/05/18   3. What type of clearance is required (medical clearance vs. Pharmacy clearance to hold med vs. Both)? both  4. Are there any medications that need to be held prior to surgery and how long? ASA   5. Practice name and name of physician performing surgery? [facility] Dr. Wynelle Link   6. What is your office phone number [phone]    7.   What is your office fax number [phone]  8.   Anesthesia type (None, local, MAC, general) ? Choice   Ryan Kerr A Ryan Kerr 08/12/2018, 5:11 PM  _________________________________________________________________   (provider comments below)

## 2018-08-14 NOTE — Telephone Encounter (Signed)
   Primary Cardiologist: Peter Sw[place]Jordan, MD  Chart reviewed as part of pre-operative protocol coverage. Patient was contacted 08/14/2018 in reference to pre-operative risk assessment for pending surgery as outlined below.  Janetta HoraCharles E Salvas was last seen on 02/10/2018 by Dr. [place]Jordan.  Since that day, Margorie JohnCharles E Tendler has done well denying any exertional chest pain or shortness of breath.  Therefore, based on ACC/AHA guidelines, the patient would be at acceptable risk for the planned procedure without further cardiovascular testing.   I will route this recommendation to the requesting party via Epic fax function and remove from pre-op pool.  Please call with questions.  Note, patient never had a heart attack, his coronary calcification was found incidentally on CT of chest. Since then, he underwent stress test in Feb 2019 which came back normal. He was last seen by Dr. [place]Jordan on 02/10/2018, Dr. Sw[place]Jordan cleared him to follow-up with cardiology only on an as needed basis. Given lack of exertional chest pain or shortness of breath, no further workup is needed. He may hold aspirin for 7 days prior to the procedure and restart after the procedure as soon as possible.   DeansHao Wanya Bangura, [place] 08/14/2018, 8:59 AM

## 2018-08-20 ENCOUNTER — Encounter: Payer: Self-pay | Admitting: Podiatry

## 2018-08-20 ENCOUNTER — Ambulatory Visit (INDEPENDENT_AMBULATORY_CARE_PROVIDER_SITE_OTHER): Payer: Federal, State, Local not specified - PPO | Admitting: Podiatry

## 2018-08-20 DIAGNOSIS — M659 Synovitis and tenosynovitis, unspecified: Secondary | ICD-10-CM | POA: Diagnosis not present

## 2018-08-20 DIAGNOSIS — M76821 Posterior tibial tendinitis, right leg: Secondary | ICD-10-CM | POA: Diagnosis not present

## 2018-08-25 NOTE — Progress Notes (Signed)
    HPI: 72 year old male presenting today for follow up evaluation of right foot and ankle pain. He states the pain is relatively unchanged since his last visit. He reports some associated swelling. He has been wearing the ankle brace which sometimes helps alleviate the pain. He states he did not start taking the Mobic because he thinks the side effects will be too bad. Patient is here for further evaluation and treatment.   Past Medical History:  Diagnosis Date  . Arthritis   . High cholesterol   . History of chicken pox   . History of kidney stones   . Hypertension   . Hypothyroidism        Physical Exam: General: The patient is alert and oriented x3 in no acute distress.  Dermatology: Skin is warm, dry and supple bilateral lower extremities. Negative for open lesions or macerations.  Vascular: Palpable pedal pulses bilaterally. No edema or erythema noted. Capillary refill within normal limits.  Neurological: Epicritic and protective threshold grossly intact bilaterally.   Musculoskeletal Exam: Pain on palpation noted to the posterior tibial tendon of the right foot as well as to the medial, lateral and anterior aspects of the right ankle. Range of motion within normal limits. Muscle strength 5/5 in all muscle groups bilateral lower extremities.   Assessment: 1. Posterior tibial tendinitis/tendinosis right  2. Right ankle synovitis    Plan of Care:  1. Patient was evaluated.  2. Injection of 0.5 mL Kenalog 40 and 1 mL of Dexamethasone injected into the posterior tibial tendon sheath.  3. Injection of 0.5 mL Kenalog 40 and 1 mL of Dexamethasone injected into the right ankle joint.  4. Continue conservative treatment at the moment.  5. Patient having right hip replacement on 11/05/18.  6. Continue using ankle brace.  7. Return to clinic as needed.    Felecia Shelling, DPM [facility]  Dr. Felecia Shelling, DPM    [address]                                         [place], [place] [address]                Office [phone]  Fax [phone]

## 2018-09-24 ENCOUNTER — Ambulatory Visit: Payer: Federal, State, Local not specified - PPO | Admitting: Family Medicine

## 2018-09-25 ENCOUNTER — Ambulatory Visit: Payer: Federal, State, Local not specified - PPO | Admitting: Family Medicine

## 2018-09-25 ENCOUNTER — Encounter: Payer: Self-pay | Admitting: General Practice

## 2018-09-25 ENCOUNTER — Other Ambulatory Visit: Payer: Self-pay

## 2018-09-25 ENCOUNTER — Encounter: Payer: Self-pay | Admitting: Family Medicine

## 2018-09-25 VITALS — BP 120/74 | HR 72 | Temp 98.2°F | Resp 16 | Ht 65.0 in | Wt 205.0 lb

## 2018-09-25 DIAGNOSIS — E039 Hypothyroidism, unspecified: Secondary | ICD-10-CM

## 2018-09-25 DIAGNOSIS — Z6834 Body mass index (BMI) 34.0-34.9, adult: Secondary | ICD-10-CM | POA: Diagnosis not present

## 2018-09-25 DIAGNOSIS — Z23 Encounter for immunization: Secondary | ICD-10-CM | POA: Diagnosis not present

## 2018-09-25 DIAGNOSIS — E785 Hyperlipidemia, unspecified: Secondary | ICD-10-CM | POA: Diagnosis not present

## 2018-09-25 DIAGNOSIS — E6609 Other obesity due to excess calories: Secondary | ICD-10-CM

## 2018-09-25 LAB — HEPATIC FUNCTION PANEL
ALT: 20 U/L (ref 0–53)
AST: 20 U/L (ref 0–37)
Albumin: 3.8 g/dL (ref 3.5–5.2)
Alkaline Phosphatase: 75 U/L (ref 39–117)
BILIRUBIN TOTAL: 0.8 mg/dL (ref 0.2–1.2)
Bilirubin, Direct: 0.2 mg/dL (ref 0.0–0.3)
Total Protein: 6.2 g/dL (ref 6.0–8.3)

## 2018-09-25 LAB — CBC WITH DIFFERENTIAL/PLATELET
Basophils Absolute: 0 10*3/uL (ref 0.0–0.1)
Basophils Relative: 0.9 % (ref 0.0–3.0)
EOS PCT: 3.8 % (ref 0.0–5.0)
Eosinophils Absolute: 0.2 10*3/uL (ref 0.0–0.7)
HEMATOCRIT: 46 % (ref 39.0–52.0)
HEMOGLOBIN: 15.4 g/dL (ref 13.0–17.0)
LYMPHS ABS: 1.5 10*3/uL (ref 0.7–4.0)
LYMPHS PCT: 26.6 % (ref 12.0–46.0)
MCHC: 33.4 g/dL (ref 30.0–36.0)
MCV: 86.5 fl (ref 78.0–100.0)
MONOS PCT: 12.3 % — AB (ref 3.0–12.0)
Monocytes Absolute: 0.7 10*3/uL (ref 0.1–1.0)
Neutro Abs: 3.2 10*3/uL (ref 1.4–7.7)
Neutrophils Relative %: 56.4 % (ref 43.0–77.0)
Platelets: 191 10*3/uL (ref 150.0–400.0)
RBC: 5.32 Mil/uL (ref 4.22–5.81)
RDW: 13.5 % (ref 11.5–15.5)
WBC: 5.7 10*3/uL (ref 4.0–10.5)

## 2018-09-25 LAB — BASIC METABOLIC PANEL
BUN: 19 mg/dL (ref 6–23)
CO2: 29 mEq/L (ref 19–32)
Calcium: 9.2 mg/dL (ref 8.4–10.5)
Chloride: 105 mEq/L (ref 96–112)
Creatinine, Ser: 0.84 mg/dL (ref 0.40–1.50)
GFR: 95.4 mL/min (ref >60.00)
Glucose, Bld: 96 mg/dL (ref 70–99)
POTASSIUM: 4.1 meq/L (ref 3.5–5.1)
Sodium: 140 mEq/L (ref 135–145)

## 2018-09-25 LAB — TSH: TSH: 2.54 u[IU]/mL (ref 0.35–4.50)

## 2018-09-25 NOTE — Progress Notes (Signed)
   Subjective:    Patient ID: Ryan Kerr, male    DOB: [birth year], 72 y.o.   MRN: [id number]  HPI Hyperlipidemia- chronic problem, on Lipitor 40mg  daily.  No CP, SOB, HAs, visual changes, abd pain, N/V.  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  Pt's most recent LDL 80  Hypothyroid- chronic problem, on Levothyroxine daily.  Denies excessive fatigue.  No changes to skin/hair/nails.  Obesity- ongoing issue.  Pt is down 15 lbs since last visit.  Pt has changed diet considerably.     Review of Systems For ROS see HPI     Objective:   Physical Exam  Constitutional: He is oriented to person, place, and time. He appears well-developed and well-nourished. No distress.  obese  HENT:  Head: Normocephalic and atraumatic.  Eyes: Pupils are equal, round, and reactive to light. Conjunctivae and EOM are normal.  Neck: Normal range of motion. Neck supple. No thyromegaly present.  Cardiovascular: Normal rate, regular rhythm, normal heart sounds and intact distal pulses.  No murmur heard. Pulmonary/Chest: Effort normal and breath sounds normal. No respiratory distress.  Abdominal: Soft. Bowel sounds are normal. He exhibits no distension.  Musculoskeletal: He exhibits no edema.  Lymphadenopathy:    He has no cervical adenopathy.  Neurological: He is alert and oriented to person, place, and time. No cranial nerve deficit.  Skin: Skin is warm and dry.  Psychiatric: He has a normal mood and affect. His behavior is normal.  Vitals reviewed.         Assessment & Plan:

## 2018-09-25 NOTE — Assessment & Plan Note (Signed)
Ongoing issue for pt.  He is down 15 lbs from last visit.  Applauded his efforts.  Will continue to follow.

## 2018-09-25 NOTE — Assessment & Plan Note (Signed)
Chronic problem.  Tolerating statin w/o difficulty.  Encouraged him to switch back to Atorvastatin from the Simvastatin as the medication change didn't improve his neuropathy.  Recent labs show LDL of 80- this is great!

## 2018-09-25 NOTE — Assessment & Plan Note (Signed)
Chronic problem.  Currently asymptomatic.  Check labs.  Adjust meds prn  

## 2018-09-25 NOTE — Patient Instructions (Addendum)
Schedule your complete physical in 6 months We'll notify you of your lab results and make any changes if needed Restart the Atorvastatin Continue to work on healthy diet and regular exercise- you're doing great! Call with any questions or concerns Happy Fall!!!

## 2018-10-23 NOTE — H&P (Signed)
TOTAL HIP ADMISSION H&P  Patient is admitted for right total hip arthroplasty.  Subjective:  Chief Complaint: right hip pain  HPI: Margorie John, 72 y.o. male, has a history of pain and functional disability in the right hip(s) due to arthritis and patient has failed non-surgical conservative treatments for greater than 12 weeks to include activity modification.  Onset of symptoms was abrupt starting 1 years ago with rapidly worsening course since that time.The patient noted no past surgery on the right hip(s).  Patient currently rates pain in the right hip at 9 out of 10 with activity. Patient has worsening of pain with activity and weight bearing and stiffness. 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 He has about 50% uncovering of the femoral head. There is bone-on-bone change on the periphery of the joint both anterosuperiorly and anteroinferiorly with osteophyte formation on the actabular rim. He has subchondral cystic changes on the acetabulum superiorly by imaging studies. This condition presents safety issues increasing the risk of falls. There is no current active infection.  Patient Active Problem List   Diagnosis Date Noted  . Sinus tarsitis of right foot 07/24/2017  . Diverticulosis 05/16/2017  . Peripheral neuropathy 07/02/2016  . Obesity 07/02/2016  . Hypothyroidism 07/02/2016  . Hyperlipidemia 07/02/2016  . Esophageal reflux 07/02/2016   Past Medical History:  Diagnosis Date  . Arthritis   . High cholesterol   . History of chicken pox   . History of kidney stones   . Hypertension   . Hypothyroidism     Past Surgical History:  Procedure Laterality Date  . APPENDECTOMY    . EXTRACORPOREAL SHOCK WAVE LITHOTRIPSY Left 08/26/2017   Procedure: LEFT EXTRACORPOREAL SHOCK WAVE LITHOTRIPSY (ESWL);  Surgeon: Ihor Gully, MD;  Location: [facility];  Service: Urology;  Laterality: Left;  . JOINT REPLACEMENT Left 2005   hip    No current  facility-administered medications for this encounter.    Current Outpatient Medications  Medication Sig Dispense Refill Last Dose  . aspirin EC 81 MG tablet Take 81 mg by mouth daily.   Taking  . atorvastatin (LIPITOR) 40 MG tablet    Taking  . Cholecalciferol (VITAMIN D) 2000 units CAPS Take by mouth.   Taking  . Garlic 1000 MG CAPS Take 1 capsule by mouth daily.   Taking  . levothyroxine (SYNTHROID, LEVOTHROID) 50 MCG tablet Take 50 mcg by mouth daily before breakfast.   Taking   Allergies  Allergen Reactions  . Fexofenadine Other (See Comments)    unknown    Social History   Tobacco Use  . Smoking status: Former Smoker    Packs/day: 2.00    Types: Cigarettes, Pipe    Last attempt to quit: 2012    Years since quitting: 7.8  . Smokeless tobacco: Never Used  Substance Use Topics  . Alcohol use: No    Family History  Problem Relation Age of Onset  . Diabetes Mother   . Diabetes Father   . Diabetes Sister   . Diabetes Brother   . Diabetes Brother   . Hyperlipidemia Brother   . Hypertension Brother   . Hypertension Brother   . Hyperlipidemia Brother      Review of Systems  Constitutional: Negative for chills and fever.  HENT: Negative for congestion, sore throat and tinnitus.   Eyes: Negative for double vision, photophobia and pain.  Respiratory: Negative for cough, shortness of breath and wheezing.   Cardiovascular: Negative for chest pain, palpitations and  orthopnea.  Gastrointestinal: Negative for heartburn, nausea and vomiting.  Genitourinary: Negative for dysuria, frequency and urgency.  Musculoskeletal: Positive for joint pain.  Neurological: Negative for dizziness, weakness and headaches.    Objective:  Physical Exam  Well nourished and well developed. General: Alert and oriented x3, cooperative and pleasant, no acute distress. Head: normocephalic, atraumatic, neck supple. Eyes: EOMI. Respiratory: breath sounds clear in all fields, no wheezing, rales, or  rhonchi. Cardiovascular: Regular rate and rhythm, no murmurs, gallops or rubs.  Abdomen: non-tender to palpation and soft, normoactive bowel sounds. Musculoskeletal: Antalgic gait pattern favoring the right side without the use of assisted devices.  Right Hip Exam: ROM: Flexion to 100, Internal Rotation 0, External Rotation 20, and Abduction 10 degrees. There is no tenderness over the greater trochanter bursa. Left Hip Exam: ROM: Flexion to 120, Internal Rotation 30, External Rotation 40, and Abduction 40 without discomfort. There is no tenderness over the greater trochanter bursa.  Calves soft and nontender. Motor function intact in LE. Strength 5/5 LE bilaterally. Neuro: Distal pulses 2+. Sensation to light touch intact in LE.  Vital signs in last 24 hours: Blood pressure: 120/80 mmHg Pulse: 64 bpm  Labs:   Estimated body mass index is 34.11 kg/m as calculated from the following:   Height as of 09/25/18: 5\' 5"  (1.651 m).   Weight as of 09/25/18: 93 kg.   Imaging Review Plain radiographs demonstrate severe degenerative joint disease of the right hip(s). The bone quality appears to be adequate for age and reported activity level.    Preoperative templating of the joint replacement has been completed, documented, and submitted to the Operating Room personnel in order to optimize intra-operative equipment management.     Assessment/Plan:  End stage arthritis, right hip(s)  The patient history, physical examination, clinical judgement of the provider and imaging studies are consistent with end stage degenerative joint disease of the right hip(s) and total hip arthroplasty is deemed medically necessary. The treatment options including medical management, injection therapy, arthroscopy and arthroplasty were discussed at length. The risks and benefits of total hip arthroplasty were presented and reviewed. The risks due to aseptic loosening, infection, stiffness,  dislocation/subluxation,  thromboembolic complications and other imponderables were discussed.  The patient acknowledged the explanation, agreed to proceed with the plan and consent was signed. Patient is being admitted for inpatient treatment for surgery, pain control, PT, OT, prophylactic antibiotics, VTE prophylaxis, progressive ambulation and ADL's and discharge planning.The patient is planning to be discharged home with HEP.   Therapy Plans: HEP Disposition: Home with wife Planned DVT Prophylaxis: Aspirin 325 mg BID DME needed: None PCP: Dr. Neena Rhymes Cardiologist: Dr. Swaziland TXA: IV Allergies: NKDA Anesthesia Concerns: None   - Patient was instructed on what medications to stop prior to surgery. - Follow-up visit in 2 weeks with Dr. Lequita Halt - Begin physical therapy following surgery - Pre-operative lab work as pre-surgical testing - Prescriptions will be provided in hospital at time of discharge  Arther Abbott, PA-C Orthopedic Surgery [facility] [place]

## 2018-10-24 NOTE — Patient Instructions (Addendum)
Ryan Kerr  01-30-1946    Your procedure is scheduled on:  11-05-2018    Report to [facility] Main  Entrance,  Report to admitting at  9:15 AM     Call this number if you have problems the morning of surgery [phone]       Remember: Do not eat food or drink liquids :After Midnight.                                       BRUSH YOUR TEETH MORNING OF SURGERY AND RINSE YOUR MOUTH OUT, NO CHEWING GUM CANDY OR MINTS.       Take these medicines the morning of surgery with A SIP OF WATER:  Loratadine (claritin), Levothyroxine                                 You may not have any metal on your body including hair pins and piercings              Do not wear jewelry, make-up, lotions, powders or perfumes, deodorant              Men may shave face and neck.      Do not bring valuables to the hospital. [facility] IS NOT             RESPONSIBLE   FOR VALUABLES.  Contacts, dentures or bridgework may not be worn into surgery.  Leave suitcase in the car. After surgery it may be brought to your room.        Special Instructions: N/A               ___________________________________________________________             [facility] - Preparing for Surgery Before surgery, you can play an important role.  Because skin is not sterile, your skin needs to be as free of germs as possible.  You can reduce the number of germs on your skin by washing with CHG (chlorahexidine gluconate) soap before surgery.  CHG is an antiseptic cleaner which kills germs and bonds with the skin to continue killing germs even after washing. Please DO NOT use if you have an allergy to CHG or antibacterial soaps.  If your skin becomes reddened/irritated stop using the CHG and inform your nurse when you arrive at Short Stay. Do not shave (including legs and underarms) for at least 48 hours prior to the first CHG shower.  You may shave your face/neck. Please follow these  instructions carefully:  1.  Shower with CHG Soap the night before surgery and the  morning of Surgery.  2.  If you choose to wash your hair, wash your hair first as usual with your  normal  shampoo.  3.  After you shampoo, rinse your hair and body thoroughly to remove the  shampoo.                                      4.  Use CHG as you would any other liquid soap.  You can apply chg directly  to the skin and wash  Gently with a scrungie or clean washcloth.  5.  Apply the CHG Soap to your body ONLY FROM THE NECK DOWN.   Do not use on face/ open                           Wound or open sores. Avoid contact with eyes, ears mouth and genitals (private parts).                       Wash face,  Genitals (private parts) with your normal soap.             6.  Wash thoroughly, paying special attention to the area where your surgery  will be performed.  7.  Thoroughly rinse your body with warm water from the neck down.  8.  DO NOT shower/wash with your normal soap after using and rinsing off  the CHG Soap.             9.  Pat yourself dry with a clean towel.            10.  Wear clean pajamas.            11.  Place clean sheets on your bed the night of your first shower and do not  sleep with pets. Day of Surgery : Do not apply any lotions/deodorants the morning of surgery.  Please wear clean clothes to the hospital/surgery center.  FAILURE TO FOLLOW THESE INSTRUCTIONS MAY RESULT IN THE CANCELLATION OF YOUR SURGERY PATIENT SIGNATURE_________________________________  NURSE SIGNATURE__________________________________  ________________________________________________________________________   Adam Phenix  An incentive spirometer is a tool that can help keep your lungs clear and active. This tool measures how well you are filling your lungs with each breath. Taking long deep breaths may help reverse or decrease the chance of developing breathing (pulmonary) problems  (especially infection) following:  A long period of time when you are unable to move or be active. BEFORE THE PROCEDURE   If the spirometer includes an indicator to show your best effort, your nurse or respiratory therapist will set it to a desired goal.  If possible, sit up straight or lean slightly forward. Try not to slouch.  Hold the incentive spirometer in an upright position. INSTRUCTIONS FOR USE  1. Sit on the edge of your bed if possible, or sit up as far as you can in bed or on a chair. 2. Hold the incentive spirometer in an upright position. 3. Breathe out normally. 4. Place the mouthpiece in your mouth and seal your lips tightly around it. 5. Breathe in slowly and as deeply as possible, raising the piston or the ball toward the top of the column. 6. Hold your breath for 3-5 seconds or for as long as possible. Allow the piston or ball to fall to the bottom of the column. 7. Remove the mouthpiece from your mouth and breathe out normally. 8. Rest for a few seconds and repeat Steps 1 through 7 at least 10 times every 1-2 hours when you are awake. Take your time and take a few normal breaths between deep breaths. 9. The spirometer may include an indicator to show your best effort. Use the indicator as a goal to work toward during each repetition. 10. After each set of 10 deep breaths, practice coughing to be sure your lungs are clear. If you have an incision (the cut made at the time of surgery), support your incision  when coughing by placing a pillow or rolled up towels firmly against it. Once you are able to get out of bed, walk around indoors and cough well. You may stop using the incentive spirometer when instructed by your caregiver.  RISKS AND COMPLICATIONS  Take your time so you do not get dizzy or light-headed.  If you are in pain, you may need to take or ask for pain medication before doing incentive spirometry. It is harder to take a deep breath if you are having  pain. AFTER USE  Rest and breathe slowly and easily.  It can be helpful to keep track of a log of your progress. Your caregiver can provide you with a simple table to help with this. If you are using the spirometer at home, follow these instructions: [place] IF:   You are having difficultly using the spirometer.  You have trouble using the spirometer as often as instructed.  Your pain medication is not giving enough relief while using the spirometer.  You develop fever of 100.5 F (38.1 C) or higher. SEEK IMMEDIATE MEDICAL CARE IF:   You cough up bloody sputum that had not been present before.  You develop fever of 102 F (38.9 C) or greater.  You develop worsening pain at or near the incision site. MAKE SURE YOU:   Understand these instructions.  Will watch your condition.  Will get help right away if you are not doing well or get worse. Document Released: 04/15/2007 Document Revised: 02/25/2012 Document Reviewed: 06/16/2007 Exit[employer] Patient Information 2014 ExitCare, [place].   ________________________________________________________________________  WHAT IS A BLOOD TRANSFUSION? Blood Transfusion Information  A transfusion is the replacement of blood or some of its parts. Blood is made up of multiple cells which provide different functions.  Red blood cells carry oxygen and are used for blood loss replacement.  White blood cells fight against infection.  Platelets control bleeding.  Plasma helps clot blood.  Other blood products are available for specialized needs, such as hemophilia or other clotting disorders. BEFORE THE TRANSFUSION  Who gives blood for transfusions?   Healthy volunteers who are fully evaluated to make sure their blood is safe. This is blood bank blood. Transfusion therapy is the safest it has ever been in the practice of medicine. Before blood is taken from a donor, a complete history is taken to make sure that person has no history  of diseases nor engages in risky social behavior (examples are intravenous drug use or sexual activity with multiple partners). The donor's travel history is screened to minimize risk of transmitting infections, such as malaria. The donated blood is tested for signs of infectious diseases, such as HIV and hepatitis. The blood is then tested to be sure it is compatible with you in order to minimize the chance of a transfusion reaction. If you or a relative donates blood, this is often done in anticipation of surgery and is not appropriate for emergency situations. It takes many days to process the donated blood. RISKS AND COMPLICATIONS Although transfusion therapy is very safe and saves many lives, the main dangers of transfusion include:   Getting an infectious disease.  Developing a transfusion reaction. This is an allergic reaction to something in the blood you were given. Every precaution is taken to prevent this. The decision to have a blood transfusion has been considered carefully by your caregiver before blood is given. Blood is not given unless the benefits outweigh the risks. AFTER THE TRANSFUSION  Right after  receiving a blood transfusion, you will usually feel much better and more energetic. This is especially true if your red blood cells have gotten low (anemic). The transfusion raises the level of the red blood cells which carry oxygen, and this usually causes an energy increase.  The nurse administering the transfusion will monitor you carefully for complications. HOME CARE INSTRUCTIONS  No special instructions are needed after a transfusion. You may find your energy is better. Speak with your caregiver about any limitations on activity for underlying diseases you may have. SEEK MEDICAL CARE IF:   Your condition is not improving after your transfusion.  You develop redness or irritation at the intravenous (IV) site. SEEK IMMEDIATE MEDICAL CARE IF:  Any of the following symptoms  occur over the next 12 hours:  Shaking chills.  You have a temperature by mouth above 102 F (38.9 C), not controlled by medicine.  Chest, back, or muscle pain.  People around you feel you are not acting correctly or are confused.  Shortness of breath or difficulty breathing.  Dizziness and fainting.  You get a rash or develop hives.  You have a decrease in urine output.  Your urine turns a dark color or changes to pink, red, or brown. Any of the following symptoms occur over the next 10 days:  You have a temperature by mouth above 102 F (38.9 C), not controlled by medicine.  Shortness of breath.  Weakness after normal activity.  The white part of the eye turns yellow (jaundice).  You have a decrease in the amount of urine or are urinating less often.  Your urine turns a dark color or changes to pink, red, or brown. Document Released: 11/30/2000 Document Revised: 02/25/2012 Document Reviewed: 07/19/2008 [facility] Patient Information 2014 [place], [place].  _______________________________________________________________________

## 2018-10-27 ENCOUNTER — Ambulatory Visit (INDEPENDENT_AMBULATORY_CARE_PROVIDER_SITE_OTHER): Payer: Federal, State, Local not specified - PPO | Admitting: Podiatry

## 2018-10-27 DIAGNOSIS — M76821 Posterior tibial tendinitis, right leg: Secondary | ICD-10-CM | POA: Diagnosis not present

## 2018-10-27 DIAGNOSIS — M659 Synovitis and tenosynovitis, unspecified: Secondary | ICD-10-CM

## 2018-10-27 DIAGNOSIS — M19071 Primary osteoarthritis, right ankle and foot: Secondary | ICD-10-CM

## 2018-10-28 ENCOUNTER — Encounter (HOSPITAL_COMMUNITY)
Admission: RE | Admit: 2018-10-28 | Discharge: 2018-10-28 | Disposition: A | Discharge status: Home / Self Care | Payer: No Typology Code available for payment source | Source: Ambulatory Visit | Attending: Orthopedic Surgery | Admitting: Orthopedic Surgery

## 2018-10-28 ENCOUNTER — Other Ambulatory Visit: Payer: Self-pay

## 2018-10-28 ENCOUNTER — Encounter (HOSPITAL_COMMUNITY): Payer: Self-pay

## 2018-10-28 DIAGNOSIS — Z01812 Encounter for preprocedural laboratory examination: Secondary | ICD-10-CM | POA: Insufficient documentation

## 2018-10-28 DIAGNOSIS — M1631 Unilateral osteoarthritis resulting from hip dysplasia, right hip: Secondary | ICD-10-CM | POA: Diagnosis not present

## 2018-10-28 HISTORY — DX: Atherosclerosis of aorta: I70.0

## 2018-10-28 HISTORY — DX: Hereditary and idiopathic neuropathy, unspecified: G60.9

## 2018-10-28 HISTORY — DX: Atherosclerotic heart disease of native coronary artery without angina pectoris: I25.10

## 2018-10-28 HISTORY — DX: Presence of spectacles and contact lenses: Z97.3

## 2018-10-28 HISTORY — DX: Residual foreign body in soft tissue: M79.5

## 2018-10-28 HISTORY — DX: Other seasonal allergic rhinitis: J30.2

## 2018-10-28 HISTORY — DX: Complete loss of teeth, unspecified cause, unspecified class: Z97.2

## 2018-10-28 HISTORY — DX: Hyperlipidemia, unspecified: E78.5

## 2018-10-28 HISTORY — DX: Rosacea, unspecified: L71.9

## 2018-10-28 HISTORY — DX: Complete loss of teeth, unspecified cause, unspecified class: K08.109

## 2018-10-28 LAB — COMPREHENSIVE METABOLIC PANEL
ALBUMIN: 4.3 g/dL (ref 3.5–5.0)
ALK PHOS: 85 U/L (ref 38–126)
ALT: 28 U/L (ref 0–44)
ANION GAP: 8 (ref 5–15)
AST: 22 U/L (ref 15–41)
BUN: 20 mg/dL (ref 8–23)
CALCIUM: 9.5 mg/dL (ref 8.9–10.3)
CO2: 25 mmol/L (ref 22–32)
Chloride: 106 mmol/L (ref 98–111)
Creatinine, Ser: 0.87 mg/dL (ref 0.61–1.24)
GFR calc Af Amer: >60 mL/min (ref >60)
GFR calc non Af Amer: >60 mL/min (ref >60)
GLUCOSE: 123 mg/dL — AB (ref 70–99)
Potassium: 4.7 mmol/L (ref 3.5–5.1)
SODIUM: 139 mmol/L (ref 135–145)
Total Bilirubin: 1 mg/dL (ref 0.3–1.2)
Total Protein: 7 g/dL (ref 6.5–8.1)

## 2018-10-28 LAB — CBC
HCT: 49 % (ref 39.0–52.0)
HEMOGLOBIN: 16 g/dL (ref 13.0–17.0)
MCH: 28.3 pg (ref 26.0–34.0)
MCHC: 32.7 g/dL (ref 30.0–36.0)
MCV: 86.7 fL (ref 80.0–100.0)
Platelets: 207 10*3/uL (ref 150–400)
RBC: 5.65 MIL/uL (ref 4.22–5.81)
RDW: 12.8 % (ref 11.5–15.5)
WBC: 16.9 10*3/uL — AB (ref 4.0–10.5)
nRBC: 0 % (ref 0.0–0.2)

## 2018-10-28 LAB — SURGICAL PCR SCREEN
MRSA, PCR: NEGATIVE
STAPHYLOCOCCUS AUREUS: NEGATIVE

## 2018-10-28 LAB — ABO/RH: ABO/RH(D): O POS

## 2018-10-28 LAB — APTT: APTT: 28 s (ref 24–36)

## 2018-10-28 LAB — PROTIME-INR
INR: 1.02
Prothrombin Time: 13.3 seconds (ref 11.4–15.2)

## 2018-10-28 NOTE — Progress Notes (Signed)
EKG dated 02-10-2018 in epic. Clearance note dated 09-02-2018 from pt pcp , dr Beverely Low in chart. Cardiology lov note dated 02-10-2018, dr Swaziland in epic. Chest CT in epic date d01-02-2018. Nuclear stress test dated 01-21-2018 in epic.
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
## 2018-11-05 ENCOUNTER — Other Ambulatory Visit: Payer: Self-pay

## 2018-11-05 ENCOUNTER — Inpatient Hospital Stay (HOSPITAL_COMMUNITY): Payer: No Typology Code available for payment source | Admitting: Certified Registered Nurse Anesthetist

## 2018-11-05 ENCOUNTER — Inpatient Hospital Stay (HOSPITAL_COMMUNITY): Payer: No Typology Code available for payment source

## 2018-11-05 ENCOUNTER — Inpatient Hospital Stay (HOSPITAL_COMMUNITY)
Admission: RE | Admit: 2018-11-05 | Discharge: 2018-11-06 | DRG: 470 | Disposition: A | Discharge status: Home / Self Care | Payer: No Typology Code available for payment source | Attending: Orthopedic Surgery | Admitting: Orthopedic Surgery

## 2018-11-05 ENCOUNTER — Encounter (HOSPITAL_COMMUNITY): Payer: Self-pay

## 2018-11-05 ENCOUNTER — Encounter (HOSPITAL_COMMUNITY)
Admission: RE | Disposition: A | Discharge status: Home / Self Care | Payer: Self-pay | Source: Home / Self Care | Attending: Orthopedic Surgery

## 2018-11-05 DIAGNOSIS — E785 Hyperlipidemia, unspecified: Secondary | ICD-10-CM | POA: Diagnosis present

## 2018-11-05 DIAGNOSIS — Z79890 Hormone replacement therapy (postmenopausal): Secondary | ICD-10-CM

## 2018-11-05 DIAGNOSIS — M25551 Pain in right hip: Secondary | ICD-10-CM | POA: Diagnosis present

## 2018-11-05 DIAGNOSIS — Q6589 Other specified congenital deformities of hip: Secondary | ICD-10-CM

## 2018-11-05 DIAGNOSIS — Z888 Allergy status to other drugs, medicaments and biological substances status: Secondary | ICD-10-CM | POA: Diagnosis not present

## 2018-11-05 DIAGNOSIS — Z6834 Body mass index (BMI) 34.0-34.9, adult: Secondary | ICD-10-CM

## 2018-11-05 DIAGNOSIS — I700 Atherosclerosis of aorta: Secondary | ICD-10-CM | POA: Diagnosis present

## 2018-11-05 DIAGNOSIS — I2510 Atherosclerotic heart disease of native coronary artery without angina pectoris: Secondary | ICD-10-CM | POA: Diagnosis present

## 2018-11-05 DIAGNOSIS — M25751 Osteophyte, right hip: Secondary | ICD-10-CM | POA: Diagnosis present

## 2018-11-05 DIAGNOSIS — Z96642 Presence of left artificial hip joint: Secondary | ICD-10-CM | POA: Diagnosis present

## 2018-11-05 DIAGNOSIS — E669 Obesity, unspecified: Secondary | ICD-10-CM | POA: Diagnosis present

## 2018-11-05 DIAGNOSIS — I10 Essential (primary) hypertension: Secondary | ICD-10-CM | POA: Diagnosis present

## 2018-11-05 DIAGNOSIS — Z7982 Long term (current) use of aspirin: Secondary | ICD-10-CM | POA: Diagnosis not present

## 2018-11-05 DIAGNOSIS — Z87891 Personal history of nicotine dependence: Secondary | ICD-10-CM

## 2018-11-05 DIAGNOSIS — E039 Hypothyroidism, unspecified: Secondary | ICD-10-CM | POA: Diagnosis present

## 2018-11-05 DIAGNOSIS — M169 Osteoarthritis of hip, unspecified: Secondary | ICD-10-CM | POA: Diagnosis present

## 2018-11-05 DIAGNOSIS — M25559 Pain in unspecified hip: Secondary | ICD-10-CM

## 2018-11-05 DIAGNOSIS — Z87442 Personal history of urinary calculi: Secondary | ICD-10-CM

## 2018-11-05 DIAGNOSIS — Z8249 Family history of ischemic heart disease and other diseases of the circulatory system: Secondary | ICD-10-CM

## 2018-11-05 DIAGNOSIS — Z96649 Presence of unspecified artificial hip joint: Secondary | ICD-10-CM

## 2018-11-05 DIAGNOSIS — Z79899 Other long term (current) drug therapy: Secondary | ICD-10-CM | POA: Diagnosis not present

## 2018-11-05 DIAGNOSIS — G609 Hereditary and idiopathic neuropathy, unspecified: Secondary | ICD-10-CM | POA: Diagnosis present

## 2018-11-05 DIAGNOSIS — Z8349 Family history of other endocrine, nutritional and metabolic diseases: Secondary | ICD-10-CM | POA: Diagnosis not present

## 2018-11-05 DIAGNOSIS — K219 Gastro-esophageal reflux disease without esophagitis: Secondary | ICD-10-CM | POA: Diagnosis present

## 2018-11-05 DIAGNOSIS — M1611 Unilateral primary osteoarthritis, right hip: Principal | ICD-10-CM | POA: Diagnosis present

## 2018-11-05 HISTORY — PX: TOTAL HIP ARTHROPLASTY: SHX124

## 2018-11-05 LAB — TYPE AND SCREEN
ABO/RH(D): O POS
Antibody Screen: NEGATIVE

## 2018-11-05 SURGERY — ARTHROPLASTY, HIP, TOTAL, ANTERIOR APPROACH
Anesthesia: Spinal | Site: Hip | Laterality: Right

## 2018-11-05 MED ORDER — LEVOTHYROXINE SODIUM 50 MCG PO TABS
50.0000 ug | ORAL_TABLET | Freq: Every day | ORAL | Status: DC
  Administered 2018-11-06: 50 ug via ORAL
  Filled 2018-11-05: qty 1

## 2018-11-05 MED ORDER — BUPIVACAINE HCL (PF) 0.25 % IJ SOLN
INTRAMUSCULAR | Status: AC
  Filled 2018-11-05: qty 30

## 2018-11-05 MED ORDER — LORATADINE 10 MG PO TABS
10.0000 mg | ORAL_TABLET | Freq: Every morning | ORAL | Status: DC
  Filled 2018-11-05: qty 1

## 2018-11-05 MED ORDER — PHENYLEPHRINE 40 MCG/ML (10ML) SYRINGE FOR IV PUSH (FOR BLOOD PRESSURE SUPPORT)
PREFILLED_SYRINGE | INTRAVENOUS | Status: DC | PRN
  Administered 2018-11-05 (×3): 80 ug via INTRAVENOUS

## 2018-11-05 MED ORDER — ALBUMIN HUMAN 5 % IV SOLN
INTRAVENOUS | Status: AC
  Filled 2018-11-05: qty 250

## 2018-11-05 MED ORDER — CEFAZOLIN SODIUM-DEXTROSE 2-4 GM/100ML-% IV SOLN
2.0000 g | INTRAVENOUS | Status: AC
  Administered 2018-11-05: 2 g via INTRAVENOUS
  Filled 2018-11-05: qty 100

## 2018-11-05 MED ORDER — PROPOFOL 10 MG/ML IV BOLUS
INTRAVENOUS | Status: DC | PRN
  Administered 2018-11-05 (×2): 20 mg via INTRAVENOUS

## 2018-11-05 MED ORDER — TRANEXAMIC ACID-NACL 1000-0.7 MG/100ML-% IV SOLN
1000.0000 mg | INTRAVENOUS | Status: AC
  Administered 2018-11-05: 1000 mg via INTRAVENOUS
  Filled 2018-11-05: qty 100

## 2018-11-05 MED ORDER — MORPHINE SULFATE (PF) 2 MG/ML IV SOLN: 0.5000 mg | INTRAVENOUS | Status: DC | PRN

## 2018-11-05 MED ORDER — DIPHENHYDRAMINE HCL 12.5 MG/5ML PO ELIX: 12.5000 mg | ORAL_SOLUTION | ORAL | Status: DC | PRN

## 2018-11-05 MED ORDER — METHOCARBAMOL 500 MG IVPB - SIMPLE MED
500.0000 mg | Freq: Four times a day (QID) | INTRAVENOUS | Status: DC | PRN
  Filled 2018-11-05: qty 50

## 2018-11-05 MED ORDER — PROPOFOL 10 MG/ML IV BOLUS
INTRAVENOUS | Status: AC
  Filled 2018-11-05: qty 20

## 2018-11-05 MED ORDER — FLEET ENEMA 7-19 GM/118ML RE ENEM: 1.0000 | ENEMA | Freq: Once | RECTAL | Status: DC | PRN

## 2018-11-05 MED ORDER — DEXAMETHASONE SODIUM PHOSPHATE 10 MG/ML IJ SOLN
INTRAMUSCULAR | Status: AC
  Filled 2018-11-05: qty 1

## 2018-11-05 MED ORDER — CEFAZOLIN SODIUM-DEXTROSE 2-4 GM/100ML-% IV SOLN
2.0000 g | Freq: Four times a day (QID) | INTRAVENOUS | Status: AC
  Administered 2018-11-05 (×2): 2 g via INTRAVENOUS
  Filled 2018-11-05 (×2): qty 100

## 2018-11-05 MED ORDER — STERILE WATER FOR IRRIGATION IR SOLN
Status: DC | PRN
  Administered 2018-11-05: 2000 mL

## 2018-11-05 MED ORDER — FENTANYL CITRATE (PF) 100 MCG/2ML IJ SOLN
INTRAMUSCULAR | Status: AC
  Filled 2018-11-05: qty 2

## 2018-11-05 MED ORDER — FENTANYL CITRATE (PF) 100 MCG/2ML IJ SOLN
INTRAMUSCULAR | Status: DC | PRN
  Administered 2018-11-05: 100 ug via INTRAVENOUS

## 2018-11-05 MED ORDER — DEXAMETHASONE SODIUM PHOSPHATE 10 MG/ML IJ SOLN: 8.0000 mg | Freq: Once | INTRAMUSCULAR | Status: DC

## 2018-11-05 MED ORDER — LIDOCAINE HCL (CARDIAC) PF 100 MG/5ML IV SOSY
PREFILLED_SYRINGE | INTRAVENOUS | Status: DC | PRN
  Administered 2018-11-05: 60 mg via INTRAVENOUS

## 2018-11-05 MED ORDER — ACETAMINOPHEN 10 MG/ML IV SOLN
1000.0000 mg | Freq: Four times a day (QID) | INTRAVENOUS | Status: DC
  Administered 2018-11-05: 1000 mg via INTRAVENOUS
  Filled 2018-11-05: qty 100

## 2018-11-05 MED ORDER — LACTATED RINGERS IV SOLN
INTRAVENOUS | Status: DC
  Administered 2018-11-05 (×2): via INTRAVENOUS

## 2018-11-05 MED ORDER — ONDANSETRON HCL 4 MG/2ML IJ SOLN: 4.0000 mg | Freq: Four times a day (QID) | INTRAMUSCULAR | Status: DC | PRN

## 2018-11-05 MED ORDER — ACETAMINOPHEN 500 MG PO TABS
500.0000 mg | ORAL_TABLET | Freq: Four times a day (QID) | ORAL | Status: AC
  Administered 2018-11-05 – 2018-11-06 (×4): 500 mg via ORAL
  Filled 2018-11-05 (×4): qty 1

## 2018-11-05 MED ORDER — DEXAMETHASONE SODIUM PHOSPHATE 10 MG/ML IJ SOLN
10.0000 mg | Freq: Once | INTRAMUSCULAR | Status: AC
  Administered 2018-11-06: 10 mg via INTRAVENOUS
  Filled 2018-11-05: qty 1

## 2018-11-05 MED ORDER — HYDROCODONE-ACETAMINOPHEN 7.5-325 MG PO TABS: 1.0000 | ORAL_TABLET | ORAL | Status: DC | PRN

## 2018-11-05 MED ORDER — ATORVASTATIN CALCIUM 40 MG PO TABS
40.0000 mg | ORAL_TABLET | Freq: Every day | ORAL | Status: DC
  Administered 2018-11-05: 40 mg via ORAL
  Filled 2018-11-05: qty 1

## 2018-11-05 MED ORDER — BUPIVACAINE HCL (PF) 0.25 % IJ SOLN
INTRAMUSCULAR | Status: DC | PRN
  Administered 2018-11-05: 30 mL

## 2018-11-05 MED ORDER — ONDANSETRON HCL 4 MG/2ML IJ SOLN
INTRAMUSCULAR | Status: DC | PRN
  Administered 2018-11-05: 4 mg via INTRAVENOUS

## 2018-11-05 MED ORDER — BISACODYL 10 MG RE SUPP: 10.0000 mg | Freq: Every day | RECTAL | Status: DC | PRN

## 2018-11-05 MED ORDER — CHLORHEXIDINE GLUCONATE 4 % EX LIQD: 60.0000 mL | Freq: Once | CUTANEOUS | Status: DC

## 2018-11-05 MED ORDER — ALBUMIN HUMAN 5 % IV SOLN
12.5000 g | Freq: Once | INTRAVENOUS | Status: AC
  Administered 2018-11-05: 12.5 g via INTRAVENOUS

## 2018-11-05 MED ORDER — ONDANSETRON HCL 4 MG/2ML IJ SOLN
INTRAMUSCULAR | Status: AC
  Filled 2018-11-05: qty 2

## 2018-11-05 MED ORDER — DOCUSATE SODIUM 100 MG PO CAPS
100.0000 mg | ORAL_CAPSULE | Freq: Two times a day (BID) | ORAL | Status: DC
  Administered 2018-11-05 – 2018-11-06 (×2): 100 mg via ORAL
  Filled 2018-11-05 (×2): qty 1

## 2018-11-05 MED ORDER — PHENOL 1.4 % MT LIQD
1.0000 | OROMUCOSAL | Status: DC | PRN
  Filled 2018-11-05: qty 177

## 2018-11-05 MED ORDER — 0.9 % SODIUM CHLORIDE (POUR BTL) OPTIME
TOPICAL | Status: DC | PRN
  Administered 2018-11-05: 1000 mL

## 2018-11-05 MED ORDER — ONDANSETRON HCL 4 MG PO TABS: 4.0000 mg | ORAL_TABLET | Freq: Four times a day (QID) | ORAL | Status: DC | PRN

## 2018-11-05 MED ORDER — SODIUM CHLORIDE 0.9 % IV SOLN
INTRAVENOUS | Status: DC
  Administered 2018-11-05 – 2018-11-06 (×2): via INTRAVENOUS

## 2018-11-05 MED ORDER — BUPIVACAINE IN DEXTROSE 0.75-8.25 % IT SOLN
INTRATHECAL | Status: DC | PRN
  Administered 2018-11-05: 1.8 mL via INTRATHECAL

## 2018-11-05 MED ORDER — PROPOFOL 10 MG/ML IV BOLUS
INTRAVENOUS | Status: AC
  Filled 2018-11-05: qty 60

## 2018-11-05 MED ORDER — TRANEXAMIC ACID-NACL 1000-0.7 MG/100ML-% IV SOLN
1000.0000 mg | Freq: Once | INTRAVENOUS | Status: AC
  Administered 2018-11-05: 1000 mg via INTRAVENOUS
  Filled 2018-11-05: qty 100

## 2018-11-05 MED ORDER — SODIUM CHLORIDE 0.9 % IV SOLN
INTRAVENOUS | Status: DC | PRN
  Administered 2018-11-05: 25 ug/min via INTRAVENOUS

## 2018-11-05 MED ORDER — METHOCARBAMOL 500 MG PO TABS
500.0000 mg | ORAL_TABLET | Freq: Four times a day (QID) | ORAL | Status: DC | PRN
  Administered 2018-11-05 (×2): 500 mg via ORAL
  Filled 2018-11-05 (×2): qty 1

## 2018-11-05 MED ORDER — METOCLOPRAMIDE HCL 5 MG/ML IJ SOLN: 5.0000 mg | Freq: Three times a day (TID) | INTRAMUSCULAR | Status: DC | PRN

## 2018-11-05 MED ORDER — METOCLOPRAMIDE HCL 5 MG PO TABS: 5.0000 mg | ORAL_TABLET | Freq: Three times a day (TID) | ORAL | Status: DC | PRN

## 2018-11-05 MED ORDER — MENTHOL 3 MG MT LOZG: 1.0000 | LOZENGE | OROMUCOSAL | Status: DC | PRN

## 2018-11-05 MED ORDER — POLYETHYLENE GLYCOL 3350 17 G PO PACK
17.0000 g | PACK | Freq: Every day | ORAL | Status: DC | PRN
  Administered 2018-11-06: 17 g via ORAL
  Filled 2018-11-05: qty 1

## 2018-11-05 MED ORDER — DEXAMETHASONE SODIUM PHOSPHATE 10 MG/ML IJ SOLN
INTRAMUSCULAR | Status: DC | PRN
  Administered 2018-11-05: 10 mg via INTRAVENOUS

## 2018-11-05 MED ORDER — PROPOFOL 500 MG/50ML IV EMUL
INTRAVENOUS | Status: DC | PRN
  Administered 2018-11-05: 100 ug/kg/min via INTRAVENOUS

## 2018-11-05 MED ORDER — ASPIRIN EC 325 MG PO TBEC
325.0000 mg | DELAYED_RELEASE_TABLET | Freq: Two times a day (BID) | ORAL | Status: DC
  Administered 2018-11-06: 325 mg via ORAL
  Filled 2018-11-05: qty 1

## 2018-11-05 MED ORDER — HYDROCODONE-ACETAMINOPHEN 5-325 MG PO TABS
1.0000 | ORAL_TABLET | ORAL | Status: DC | PRN
  Administered 2018-11-05 – 2018-11-06 (×3): 2 via ORAL
  Filled 2018-11-05 (×3): qty 2

## 2018-11-05 SURGICAL SUPPLY — 42 items
BAG DECANTER FOR FLEXI CONT (MISCELLANEOUS) ×3 IMPLANT
BAG SPEC THK2 15X12 ZIP CLS (MISCELLANEOUS)
BAG ZIPLOCK 12X15 (MISCELLANEOUS) IMPLANT
BLADE SAG 18X100X1.27 (BLADE) ×3 IMPLANT
CLOSURE WOUND 1/2 X4 (GAUZE/BANDAGES/DRESSINGS) ×2
COVER PERINEAL POST (MISCELLANEOUS) ×3 IMPLANT
COVER SURGICAL LIGHT HANDLE (MISCELLANEOUS) ×3 IMPLANT
COVER WAND RF STERILE (DRAPES) IMPLANT
CUP ACETBLR 54 OD PINNACLE (Hips) ×2 IMPLANT
DECANTER SPIKE VIAL GLASS SM (MISCELLANEOUS) ×3 IMPLANT
DRAPE STERI IOBAN 125X83 (DRAPES) ×3 IMPLANT
DRAPE U-SHAPE 47X51 STRL (DRAPES) ×6 IMPLANT
DRSG ADAPTIC 3X8 NADH LF (GAUZE/BANDAGES/DRESSINGS) ×3 IMPLANT
DRSG MEPILEX BORDER 4X4 (GAUZE/BANDAGES/DRESSINGS) ×3 IMPLANT
DRSG MEPILEX BORDER 4X8 (GAUZE/BANDAGES/DRESSINGS) ×3 IMPLANT
DURAPREP 26ML APPLICATOR (WOUND CARE) ×3 IMPLANT
ELECT REM PT RETURN 15FT ADLT (MISCELLANEOUS) ×3 IMPLANT
EVACUATOR 1/8 PVC DRAIN (DRAIN) ×3 IMPLANT
GLOVE BIO SURGEON STRL SZ7 (GLOVE) ×3 IMPLANT
GLOVE BIO SURGEON STRL SZ8 (GLOVE) ×3 IMPLANT
GLOVE BIOGEL PI IND STRL 7.0 (GLOVE) ×1 IMPLANT
GLOVE BIOGEL PI IND STRL 8 (GLOVE) ×1 IMPLANT
GLOVE BIOGEL PI INDICATOR 7.0 (GLOVE) ×2
GLOVE BIOGEL PI INDICATOR 8 (GLOVE) ×2
GOWN STRL REUS W/TWL LRG LVL3 (GOWN DISPOSABLE) ×3 IMPLANT
GOWN STRL REUS W/TWL XL LVL3 (GOWN DISPOSABLE) ×3 IMPLANT
HEAD CERAMIC DELTA 36 PLUS 1.5 (Hips) ×2 IMPLANT
HOLDER FOLEY CATH W/STRAP (MISCELLANEOUS) ×3 IMPLANT
LINER MARATHON NEUT +4X54X36 (Hips) ×2 IMPLANT
MANIFOLD NEPTUNE II (INSTRUMENTS) ×3 IMPLANT
PACK ANTERIOR HIP CUSTOM (KITS) ×3 IMPLANT
STEM FEMORAL SZ5 HIGH ACTIS (Nail) ×2 IMPLANT
STRIP CLOSURE SKIN 1/2X4 (GAUZE/BANDAGES/DRESSINGS) ×3 IMPLANT
SUT ETHIBOND NAB CT1 #1 30IN (SUTURE) ×3 IMPLANT
SUT MNCRL AB 4-0 PS2 18 (SUTURE) ×3 IMPLANT
SUT STRATAFIX 0 PDS 27 VIOLET (SUTURE) ×3
SUT VIC AB 2-0 CT1 27 (SUTURE) ×6
SUT VIC AB 2-0 CT1 TAPERPNT 27 (SUTURE) ×2 IMPLANT
SUTURE STRATFX 0 PDS 27 VIOLET (SUTURE) ×1 IMPLANT
SYR 50ML LL SCALE MARK (SYRINGE) IMPLANT
TRAY FOLEY MTR SLVR 16FR STAT (SET/KITS/TRAYS/PACK) ×3 IMPLANT
YANKAUER SUCT BULB TIP 10FT TU (MISCELLANEOUS) ×3 IMPLANT

## 2018-11-05 NOTE — Anesthesia Preprocedure Evaluation (Addendum)
Anesthesia Evaluation  Patient identified by MRN, date of birth, ID band Patient awake    Reviewed: Allergy & Precautions, NPO status , Patient's Chart, lab work & pertinent test results  Airway Mallampati: I  TM Distance: >3 FB Neck ROM: Full    Dental  (+) Teeth Intact, Dental Advisory Given   Pulmonary former smoker,    breath sounds clear to auscultation       Cardiovascular + CAD   Rhythm:Regular Rate:Normal     Neuro/Psych    GI/Hepatic Neg liver ROS, GERD  ,  Endo/Other  Hypothyroidism   Renal/GU negative Renal ROS     Musculoskeletal  (+) Arthritis ,   Abdominal (+) + obese,   Peds  Hematology negative hematology ROS (+)   Anesthesia Other Findings   Reproductive/Obstetrics                            Anesthesia Physical Anesthesia Plan  ASA: II  Anesthesia Plan: Spinal   Post-op Pain Management:    Induction: Intravenous  PONV Risk Score and Plan: 2 and Ondansetron and Propofol infusion  Airway Management Planned: Natural Airway and Simple Face Mask  Additional Equipment: None  Intra-op Plan:   Post-operative Plan:   Informed Consent: I have reviewed the patients History and Physical, chart, labs and discussed the procedure including the risks, benefits and alternatives for the proposed anesthesia with the patient or authorized representative who has indicated his/her understanding and acceptance.     Plan Discussed with: CRNA  Anesthesia Plan Comments:        Anesthesia Quick Evaluation

## 2018-11-05 NOTE — Op Note (Signed)
OPERATIVE REPORT- TOTAL HIP ARTHROPLASTY   PREOPERATIVE DIAGNOSIS: Osteoarthritis of the Right hip.   POSTOPERATIVE DIAGNOSIS: Osteoarthritis of the Right  hip.   PROCEDURE: Right total hip arthroplasty, anterior approach.   SURGEON: Ollen GrossFrank Kalah Pflum, MD   ASSISTANT: Arther AbbottKristie Edmisten, PA-C  ANESTHESIA:  Spinal  ESTIMATED BLOOD LOSS:-400 mL    DRAINS: Hemovac x1.   COMPLICATIONS: None   CONDITION: PACU - hemodynamically stable.   BRIEF CLINICAL NOTE: Ryan Kerr is a 72 y.o. male who has advanced end-  stage arthritis of their Right  hip with progressively worsening pain and  dysfunction.The patient has failed nonoperative management and presents for  total hip arthroplasty.   PROCEDURE IN DETAIL: After successful administration of spinal  anesthetic, the traction boots [facility] were [facility] on both  feet and the patient was [facility] the [facility], boots placed into the leg  holders. The Right hip was then isolated from the perineum with plastic  drapes and prepped and draped in the usual sterile fashion. ASIS and  greater trochanter were marked and a oblique incision was made, starting  at about 1 cm lateral and 2 cm distal to the ASIS and coursing towards  the anterior cortex of the femur. The skin was cut with a 10 blade  through subcutaneous tissue to the level of the fascia overlying the  tensor fascia lata muscle. The fascia was then incised in line with the  incision at the junction of the anterior third and posterior 2/3rd. The  muscle was teased off the fascia and then the interval between the TFL  and the rectus was developed. The Hohmann retractor was then placed at  the top of the femoral neck over the capsule. The vessels overlying the  capsule were cauterized and the fat on top of the capsule was removed.  A Hohmann retractor was then placed anterior underneath the rectus  femoris to give exposure to the entire anterior capsule. A T-shaped   capsulotomy was performed. The edges were tagged and the femoral head  was identified.       Osteophytes are removed off the superior acetabulum.  The femoral neck was then cut in situ with an oscillating saw. Traction  was then applied to the left lower extremity utilizing the [facility]  traction. The femoral head was then removed. Retractors were placed  around the acetabulum and then circumferential removal of the labrum was  performed. Osteophytes were also removed. Reaming starts at 49 mm to  medialize and  Increased in 2 mm increments to 53 mm. We reamed in  approximately 40 degrees of abduction, 20 degrees anteversion. A 54 mm  pinnacle acetabular shell was then impacted in anatomic position under  fluoroscopic guidance with excellent purchase. We did not need to place  any additional dome screws. A 36 mm neutral + 4 marathon liner was then  placed into the acetabular shell.       The femoral lift was then placed along the lateral aspect of the femur  just distal to the vastus ridge. The leg was  externally rotated and capsule  was stripped off the inferior aspect of the femoral neck down to the  level of the lesser trochanter, this was done with electrocautery. The femur was lifted after this was performed. The  leg was then placed in an extended and adducted position essentially delivering the femur. We also removed the capsule superiorly and the piriformis from the piriformis  fossa to gain excellent exposure of the  proximal femur. Rongeur was used to remove some cancellous bone to get  into the lateral portion of the proximal femur for placement of the  initial starter reamer. The starter broaches was placed  the starter broach  and was shown to go down the center of the canal. Broaching  with the Actis system was then performed starting at size 0  coursing  Up to size 5. A size 5 had excellent torsional and rotational  and axial stability. The trial high offset neck was then placed   with a 36 + 1.5 trial head. The hip was then reduced. We confirmed that  the stem was in the canal both on AP and lateral x-rays. It also has excellent sizing. The hip was reduced with outstanding stability through full extension and full external rotation.. AP pelvis was taken and the leg lengths were measured and found to be equal. Hip was then dislocated again and the femoral head and neck removed. The  femoral broach was removed. Size 5 Actis stem with a high offset  neck was then impacted into the femur following native anteversion. Has  excellent purchase in the canal. Excellent torsional and rotational and  axial stability. It is confirmed to be in the canal on AP and lateral  fluoroscopic views. The 36 + 1.5 ceramic head was placed and the hip  reduced with outstanding stability. Again AP pelvis was taken and it  confirmed that the leg lengths were equal. The wound was then copiously  irrigated with saline solution and the capsule reattached and repaired  with Ethibond suture. 30 ml of .25% Bupivicaine was  injected into the capsule and into the edge of the tensor fascia lata as well as subcutaneous tissue. The fascia overlying the tensor fascia lata was then closed with a running #1 V-Loc. Subcu was closed with interrupted 2-0 Vicryl and subcuticular running 4-0 Monocryl. Incision was cleaned  and dried. Steri-Strips and a bulky sterile dressing applied. Hemovac  drain was hooked to suction and then the patient was awakened and transported to  recovery in stable condition.        Please note that a surgical assistant was a medical necessity for this procedure to perform it in a safe and expeditious manner. Assistant was necessary to provide appropriate retraction of vital neurovascular structures and to prevent femoral fracture and allow for anatomic placement of the prosthesis.  Gaynelle Arabian, M.D.

## 2018-11-05 NOTE — Interval H&P Note (Signed)
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 After consideration of risks, benefits and other options for treatment, the patient has consented to  Procedure(s) with comments: RIGHT TOTAL HIP ARTHROPLASTY ANTERIOR APPROACH (Right) - 100min as a surgical intervention .  The patient's history has been reviewed, patient examined, no change in status, stable for surgery.  I have reviewed the patient's chart and labs.  Questions were answered to the patient's satisfaction.     Homero FellersFrank Maycel Riffe

## 2018-11-05 NOTE — Plan of Care (Signed)

## 2018-11-05 NOTE — Evaluation (Signed)
Physical Therapy Evaluation Patient Details Name: Ryan JohnCharles E Rabon MRN: [id number] DOB: 03/21/1946 Today's Date: 11/05/2018   History of Present Illness  72 yo male s/p R DA-THA on 11/05/18. PMH includes diverticulosis, peripheral neuropathy, obesity, HLD, HTN, L THA 2005.   Clinical Impression   Pt presents with R hip pain, increased time and effort to perform mobility tasks, decreased R hip ROM in direction of flexion, and decreased tolerance for ambulation due to pain. Pt to benefit from acute PT to address deficits. Pt ambulated 60 ft with RW with min guard assist, required verbal cuing throughout. Pt educated on quad sets (5-10/hour), ankle pumps (20/hour), and heel slides (5-10/hour) to perform this afternoon/evening to lessen stiffness and increase circulation, to pt's tolerance and limited by pain. PT to progress mobility as tolerated, and will continue to follow acutely.      Follow Up Recommendations Follow surgeon's recommendation for DC plan and follow-up therapies;Supervision for mobility/OOB    Equipment Recommendations  None recommended by PT    Recommendations for Other Services       Precautions / Restrictions Precautions Precautions: Fall Restrictions Weight Bearing Restrictions: No Other Position/Activity Restrictions: WBAT       Mobility  Bed Mobility Overal bed mobility: Needs Assistance Bed Mobility: Supine to Sit     Supine to sit: Min assist;HOB elevated     General bed mobility comments: Min assist for RLE management. Verbal cuing for sequencing. Increased time to scoot to EOB. Pt reported feeling lightheaded at EOB, BP 138/88 with HR of 85 bpm. Pt reports feelings of lightheadedness were momentary.  Transfers Overall transfer level: Needs assistance Equipment used: Rolling walker (2 wheeled) Transfers: Sit to/from Stand Sit to Stand: Min guard         General transfer comment: Min guard for safety. Verbal cuing for hand placement.    Ambulation/Gait Ambulation/Gait assistance: Min guard Gait Distance (Feet): 60 Feet Assistive device: Rolling walker (2 wheeled) Gait Pattern/deviations: Step-to pattern;Decreased stride length;Step-through pattern;Decreased weight shift to right;Decreased stance time - right;Antalgic Gait velocity: decr    General Gait Details: Min guard for safety. Verbal cuing for placement in RW, sequencing with step-to gait although pt quickly progressed to step-through gait, and turning towards non-operative side.   Stairs            Wheelchair Mobility    Modified Rankin (Stroke Patients Only)       Balance Overall balance assessment: Mild deficits observed, not formally tested                                           Pertinent Vitals/Pain Pain Assessment: 0-10 Pain Score: 4  Pain Location: R hip  Pain Descriptors / Indicators: Sore Pain Intervention(s): Limited activity within patient's tolerance;Repositioned;Monitored during session    Home Living Family/patient expects to be discharged to:: Private residence Living Arrangements: Spouse/significant other Available Help at Discharge: Family;Available 24 hours/day Type of Home: House Home Access: Stairs to enter Entrance Stairs-Rails: Left;Right;Can reach both Entrance Stairs-Number of Steps: 3 Home Layout: Multi-level;Able to live on main level with bedroom/bathroom Home Equipment: Crutches;Walker - 2 wheels;Cane - single point;Shower seat Additional Comments: Pt prefers to ambulate with crutches vs RW    Prior Function Level of Independence: Independent               Hand Dominance   Dominant Hand: Right  Extremity/Trunk Assessment   Upper Extremity Assessment Upper Extremity Assessment: Overall WFL for tasks assessed    Lower Extremity Assessment Lower Extremity Assessment: Overall WFL for tasks assessed;RLE deficits/detail RLE Deficits / Details: Suspected post-surgical weakness;  able to perform ankle pumps, quad sets, heel slides  RLE Sensation: WNL    Cervical / Trunk Assessment Cervical / Trunk Assessment: Normal  Communication   Communication: No difficulties  Cognition Arousal/Alertness: Awake/alert Behavior During Therapy: WFL for tasks assessed/performed Overall Cognitive Status: Within Functional Limits for tasks assessed                                        General Comments      Exercises     Assessment/Plan    PT Assessment Patient needs continued PT services  PT Problem List Decreased strength;Pain;Decreased range of motion;Decreased activity tolerance;Decreased knowledge of use of DME;Decreased balance;Decreased safety awareness;Decreased mobility       PT Treatment Interventions DME instruction;Therapeutic activities;Gait training;Therapeutic exercise;Patient/family education;Stair training;Balance training;Functional mobility training    PT Goals (Current goals can be found in the Care Plan section)  Acute Rehab PT Goals Patient Stated Goal: none stated  PT Goal Formulation: With patient Time For Goal Achievement: 11/12/18 Potential to Achieve Goals: Good    Frequency 7X/week   Barriers to discharge        Co-evaluation               AM-PAC PT "6 Clicks" Daily Activity  Outcome Measure Difficulty turning over in bed (including adjusting bedclothes, sheets and blankets)?: Unable Difficulty moving from lying on back to sitting on the side of the bed? : Unable Difficulty sitting down on and standing up from a chair with arms (e.g., wheelchair, bedside commode, etc,.)?: Unable Help needed moving to and from a bed to chair (including a wheelchair)?: A Little Help needed walking in hospital room?: A Little Help needed climbing 3-5 steps with a railing? : A Little 6 Click Score: 12    End of Session Equipment Utilized During Treatment: Gait belt;Oxygen(O2 reapplied at end of session) Activity Tolerance:  Patient tolerated treatment well Patient left: in bed;with bed alarm set;with call bell/phone within reach;with SCD's reapplied;with nursing/sitter in room Nurse Communication: Mobility status PT Visit Diagnosis: Other abnormalities of gait and mobility (R26.89);Difficulty in walking, not elsewhere classified (R26.2)    Time: 1837-1910 PT Time Calculation (min) (ACUTE ONLY): 33 min   Charges:   PT Evaluation $PT Eval Low Complexity: 1 Low PT Treatments $Gait Training: 8-22 mins        Nicola Police, PT Acute Rehabilitation Services Pager [phone]  Office [phone]   Tyrone Apple D Despina Hidden 11/05/2018, 7:37 PM

## 2018-11-05 NOTE — Anesthesia Postprocedure Evaluation (Signed)
Anesthesia Post Note  Patient: Margorie JohnCharles E Sear  Procedure(s) Performed: RIGHT TOTAL HIP ARTHROPLASTY ANTERIOR APPROACH (Right Hip)     Patient location during evaluation: PACU Anesthesia Type: Spinal Level of consciousness: oriented and awake and alert Pain management: pain level controlled Vital Signs Assessment: post-procedure vital signs reviewed and stable Respiratory status: spontaneous breathing, respiratory function stable and patient connected to nasal cannula oxygen Cardiovascular status: blood pressure returned to baseline and stable Postop Assessment: no headache, no backache, no apparent nausea or vomiting, spinal receding and patient able to bend at knees Anesthetic complications: no    Last Vitals:  Vitals:   11/05/18 1530 11/05/18 1549  BP: 112/72 125/84  Pulse: 62 67  Resp: 18 17  Temp: (!) 36.4 C (!) 36.3 C  SpO2: 100% 100%    Last Pain:  Vitals:   11/05/18 1549  TempSrc: Oral  PainSc:                  Shelton SilvasKevin D Percival Glasheen

## 2018-11-05 NOTE — Transfer of Care (Signed)
Immediate Anesthesia Transfer of Care Note  Patient: Ryan Kerr  Procedure(s) Performed: RIGHT TOTAL HIP ARTHROPLASTY ANTERIOR APPROACH (Right Hip)  Patient Location: PACU  Anesthesia Type:MAC and Spinal  Level of Consciousness: drowsy  Airway & Oxygen Therapy: Patient Spontanous Breathing and Patient connected to face mask oxygen  Post-op Assessment: Report given to RN and Post -op Vital signs reviewed and stable  Post vital signs: Reviewed and stable  Last Vitals:  Vitals Value Taken Time  BP 93/49 11/05/2018  1:15 PM  Temp    Pulse 67 11/05/2018  1:16 PM  Resp 12 11/05/2018  1:16 PM  SpO2 97 % 11/05/2018  1:16 PM  Vitals shown include unvalidated device data.  Last Pain:  Vitals:   11/05/18 0931  TempSrc:   PainSc: 0-No pain      Patients Stated Pain Goal: 4 (36/64/40 3474)  Complications: No apparent anesthesia complications

## 2018-11-05 NOTE — Anesthesia Procedure Notes (Signed)
Spinal  Start time: 11/05/2018 11:28 AM End time: 11/05/2018 11:30 AM Staffing Anesthesiologist: Shelton SilvasHollis, Lamonda Noxon D, MD Performed: anesthesiologist  Preanesthetic Checklist Completed: patient identified, site marked, surgical consent, pre-op evaluation, timeout performed, IV checked, risks and benefits discussed and monitors and equipment checked Spinal Block Patient position: sitting Prep: site prepped and draped and DuraPrep Location: L3-4 Injection technique: single-shot Needle Needle type: Pencan  Needle gauge: 24 G Needle length: 10 cm Needle insertion depth: 10 cm Additional Notes Patient tolerated well. No immediate complications.

## 2018-11-06 ENCOUNTER — Encounter (HOSPITAL_COMMUNITY): Payer: Self-pay | Admitting: Orthopedic Surgery

## 2018-11-06 LAB — CBC
HCT: 40.9 % (ref 39.0–52.0)
HEMOGLOBIN: 13.2 g/dL (ref 13.0–17.0)
MCH: 28.4 pg (ref 26.0–34.0)
MCHC: 32.3 g/dL (ref 30.0–36.0)
MCV: 88.1 fL (ref 80.0–100.0)
Platelets: 170 10*3/uL (ref 150–400)
RBC: 4.64 MIL/uL (ref 4.22–5.81)
RDW: 12.7 % (ref 11.5–15.5)
WBC: 18.1 10*3/uL — ABNORMAL HIGH (ref 4.0–10.5)
nRBC: 0 % (ref 0.0–0.2)

## 2018-11-06 LAB — BASIC METABOLIC PANEL
Anion gap: 7 (ref 5–15)
BUN: 22 mg/dL (ref 8–23)
CHLORIDE: 106 mmol/L (ref 98–111)
CO2: 25 mmol/L (ref 22–32)
Calcium: 8.5 mg/dL — ABNORMAL LOW (ref 8.9–10.3)
Creatinine, Ser: 0.76 mg/dL (ref 0.61–1.24)
GFR calc Af Amer: >60 mL/min (ref >60)
GFR calc non Af Amer: >60 mL/min (ref >60)
GLUCOSE: 135 mg/dL — AB (ref 70–99)
POTASSIUM: 4.3 mmol/L (ref 3.5–5.1)
Sodium: 138 mmol/L (ref 135–145)

## 2018-11-06 MED ORDER — ASPIRIN 325 MG PO TBEC: 325.0000 mg | DELAYED_RELEASE_TABLET | Freq: Two times a day (BID) | ORAL | 0 refills | Status: AC

## 2018-11-06 MED ORDER — METHOCARBAMOL 500 MG PO TABS: 500.0000 mg | ORAL_TABLET | Freq: Four times a day (QID) | ORAL | 0 refills | Status: DC | PRN

## 2018-11-06 MED ORDER — HYDROCODONE-ACETAMINOPHEN 5-325 MG PO TABS: 1.0000 | ORAL_TABLET | Freq: Four times a day (QID) | ORAL | 0 refills | Status: DC | PRN
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
## 2018-11-06 NOTE — Discharge Instructions (Signed)
°Dr. Frank Aluisio °Total Joint Specialist °[facility] °[address] °[place], [place] [address] °[phone] ° °ANTERIOR APPROACH TOTAL HIP REPLACEMENT POSTOPERATIVE DIRECTIONS ° ° °Hip Rehabilitation, Guidelines Following Surgery  °The results of a hip operation are greatly improved after range of motion and muscle strengthening exercises. Follow all safety measures which are given to protect your hip. If any of these exercises cause increased pain or swelling in your joint, decrease the amount until you are comfortable again. Then slowly increase the exercises. Call your caregiver if you have problems or questions.  ° °HOME CARE INSTRUCTIONS  °• Remove items at home which could result in a fall. This includes throw rugs or furniture in walking pathways.  °· ICE to the affected hip every three hours for 30 minutes at a time and then as needed for pain and swelling.  Continue to use ice on the hip for pain and swelling from surgery. You may notice swelling that will progress down to the foot and ankle.  This is normal after surgery.  Elevate the leg when you are not up walking on it.   °· Continue to use the breathing machine which will help keep your temperature down.  It is common for your temperature to cycle up and down following surgery, especially at night when you are not up moving around and exerting yourself.  The breathing machine keeps your lungs expanded and your temperature down. ° °DIET °You may resume your previous home diet once your are discharged from the hospital. ° °DRESSING / WOUND CARE / SHOWERING °You may shower 3 days after surgery, but keep the wounds dry during showering.  You may use an occlusive plastic wrap (Press'n Seal for example), NO SOAKING/SUBMERGING IN THE BATHTUB.  If the bandage gets wet, change with a clean dry gauze.  If the incision gets wet, pat the wound dry with a clean towel. °You may start showering once you are discharged home but do not submerge the  incision under water. Just pat the incision dry and apply a dry gauze dressing on daily. °Change the surgical dressing daily and reapply a dry dressing each time. ° °ACTIVITY °Walk with your walker as instructed. °Use walker as long as suggested by your caregivers. °Avoid periods of inactivity such as sitting longer than an hour when not asleep. This helps prevent blood clots.  °You may resume a sexual relationship in one month or when given the OK by your doctor.  °You may return to work once you are cleared by your doctor.  °Do not drive a car for 6 weeks or until released by you surgeon.  °Do not drive while taking narcotics. ° °WEIGHT BEARING °Weight bearing as tolerated with assist device (walker, cane, etc) as directed, use it as long as suggested by your surgeon or therapist, typically at least 4-6 weeks. ° °POSTOPERATIVE CONSTIPATION PROTOCOL °Constipation - defined medically as fewer than three stools per week and severe constipation as less than one stool per week. ° °One of the most common issues patients have following surgery is constipation.  Even if you have a regular bowel pattern at home, your normal regimen is likely to be disrupted due to multiple reasons following surgery.  Combination of anesthesia, postoperative narcotics, change in appetite and fluid intake all can affect your bowels.  In order to avoid complications following surgery, here are some recommendations in order to help you during your recovery period. ° °Colace (docusate) - Pick up an over-the-counter form   of Colace or another stool softener and take twice a day as long as you are requiring postoperative pain medications.  Take with a full glass of water daily.  If you experience loose stools or diarrhea, hold the colace until you stool forms back up.  If your symptoms do not get better within 1 week or if they get worse, check with your doctor. ° °Dulcolax (bisacodyl) - Pick up over-the-counter and take as directed by the product  packaging as needed to assist with the movement of your bowels.  Take with a full glass of water.  Use this product as needed if not relieved by Colace only.  ° °MiraLax (polyethylene glycol) - Pick up over-the-counter to have on hand.  MiraLax is a solution that will increase the amount of water in your bowels to assist with bowel movements.  Take as directed and can mix with a glass of water, juice, soda, coffee, or tea.  Take if you go more than two days without a movement. °Do not use MiraLax more than once per day. Call your doctor if you are still constipated or irregular after using this medication for 7 days in a row. ° °If you continue to have problems with postoperative constipation, please contact the office for further assistance and recommendations.  If you experience "the worst abdominal pain ever" or develop nausea or vomiting, please contact the office immediatly for further recommendations for treatment. ° °ITCHING ° If you experience itching with your medications, try taking only a single pain pill, or even half a pain pill at a time.  You can also use Benadryl over the counter for itching or also to help with sleep.  ° °TED HOSE STOCKINGS °Wear the elastic stockings on both legs for three weeks following surgery during the day but you may remove then at night for sleeping. ° °MEDICATIONS °See your medication summary on the “After Visit Summary” that the nursing staff will review with you prior to discharge.  You may have some home medications which will be placed on hold until you complete the course of blood thinner medication.  It is important for you to complete the blood thinner medication as prescribed by your surgeon.  Continue your approved medications as instructed at time of discharge. ° °PRECAUTIONS °If you experience chest pain or shortness of breath - call 911 immediately for transfer to the hospital emergency department.  °If you develop a fever greater that 101 F, purulent drainage  from wound, increased redness or drainage from wound, foul odor from the wound/dressing, or calf pain - CONTACT YOUR SURGEON.   °                                                °FOLLOW-UP APPOINTMENTS °Make sure you keep all of your appointments after your operation with your surgeon and caregivers. You should call the office at the above phone number and make an appointment for approximately two weeks after the date of your surgery or on the date instructed by your surgeon outlined in the "After Visit Summary". ° °RANGE OF MOTION AND STRENGTHENING EXERCISES  °These exercises are designed to help you keep full movement of your hip joint. Follow your caregiver's or physical therapist's instructions. Perform all exercises about fifteen times, three times per day or as directed. Exercise both hips, even if you have   had only one joint replacement. These exercises can be done on a training (exercise) mat, on the floor, on a table or on a bed. Use whatever works the best and is most comfortable for you. Use music or television while you are exercising so that the exercises are a pleasant break in your day. This will make your life better with the exercises acting as a break in routine you can look forward to.  °• Lying on your back, slowly slide your foot toward your buttocks, raising your knee up off the floor. Then slowly slide your foot back down until your leg is straight again.  °• Lying on your back spread your legs as far apart as you can without causing discomfort.  °• Lying on your side, raise your upper leg and foot straight up from the floor as far as is comfortable. Slowly lower the leg and repeat.  °• Lying on your back, tighten up the muscle in the front of your thigh (quadriceps muscles). You can do this by keeping your leg straight and trying to raise your heel off the floor. This helps strengthen the largest muscle supporting your knee.  °• Lying on your back, tighten up the muscles of your buttocks both  with the legs straight and with the knee bent at a comfortable angle while keeping your heel on the floor.  ° °IF YOU ARE TRANSFERRED TO A SKILLED REHAB FACILITY °If the patient is transferred to a skilled rehab facility following release from the hospital, a list of the current medications will be sent to the facility for the patient to continue.  When discharged from the skilled rehab facility, please have the facility set up the patient's Home Health Physical Therapy prior to being released. Also, the skilled facility will be responsible for providing the patient with their medications at time of release from the facility to include their pain medication, the muscle relaxants, and their blood thinner medication. If the patient is still at the rehab facility at time of the two week follow up appointment, the skilled rehab facility will also need to assist the patient in arranging follow up appointment in our office and any transportation needs. ° °MAKE SURE YOU:  °• Understand these instructions.  °• Get help right away if you are not doing well or get worse.  ° ° °Pick up stool softner and laxative for home use following surgery while on pain medications. °Do not submerge incision under water. °Please use good hand washing techniques while changing dressing each day. °May shower starting three days after surgery. °Please use a clean towel to pat the incision dry following showers. °Continue to use ice for pain and swelling after surgery. °Do not use any lotions or creams on the incision until instructed by your surgeon. ° °

## 2018-11-06 NOTE — Progress Notes (Signed)
At 1150, the pt was provided with d/c instructions and prescriptions. After discussing the pt's plan of care upon d/c home, the pt reported no further questions or concerns. The pt needs to complete stair prior to be d/c home today. Pt is aware. Pt reported no pain at this time.

## 2018-11-06 NOTE — Progress Notes (Signed)
Physical Therapy Treatment Patient Details Name: Ryan Kerr MRN: [id number] DOB: 11/26/1946 Today's Date: 11/06/2018    History of Present Illness 72 yo male s/p R DA-THA on 11/05/18. PMH includes diverticulosis, peripheral neuropathy, obesity, HLD, HTN, L THA 2005.     PT Comments    POD # 1 am session Assisted out of recliner to amb a greater distance in hallway then Performed some TE's following HEP handout.  Instructed on proper tech, freq as well as use of ICE.     Follow Up Recommendations  Follow surgeon's recommendation for DC plan and follow-up therapies;Supervision for mobility/OOB(HEP)     Equipment Recommendations  None recommended by PT    Recommendations for Other Services       Precautions / Restrictions Precautions Precautions: Fall Restrictions Weight Bearing Restrictions: No Other Position/Activity Restrictions: WBAT     Mobility  Bed Mobility               General bed mobility comments: OOB in recliner   Transfers Overall transfer level: Needs assistance Equipment used: Rolling walker (2 wheeled) Transfers: Sit to/from Stand Sit to Stand: Supervision;Min guard         General transfer comment: 25% VC's on safety with turns and hand placement with stand to sit  Ambulation/Gait Ambulation/Gait assistance: Supervision;Min guard Gait Distance (Feet): 75 Feet Assistive device: Rolling walker (2 wheeled) Gait Pattern/deviations: Step-to pattern;Decreased stride length;Step-through pattern;Decreased weight shift to right;Decreased stance time - right;Antalgic Gait velocity: decr    General Gait Details: <25% VC's on proper walker to self distance and safety with turns    Stairs             Wheelchair Mobility    Modified Rankin (Stroke Patients Only)       Balance                                            Cognition Arousal/Alertness: Awake/alert Behavior During Therapy: WFL for tasks  assessed/performed Overall Cognitive Status: Within Functional Limits for tasks assessed                                        Exercises   Total Hip Replacement TE's 10 reps ankle pumps 10 reps knee presses 10 reps heel slides 10 reps SAQ's 10 reps ABD Followed by ICE     General Comments        Pertinent Vitals/Pain Pain Assessment: No/denies pain Pain Score: 3  Pain Location: R hip  Pain Descriptors / Indicators: Sore Pain Intervention(s): Monitored during session;Repositioned;Ice applied    Home Living                      Prior Function            PT Goals (current goals can now be found in the care plan section) Progress towards PT goals: Progressing toward goals    Frequency    7X/week      PT Plan Current plan remains appropriate    Co-evaluation              AM-PAC PT "6 Clicks" Daily Activity  Outcome Measure  Difficulty turning over in bed (including adjusting bedclothes, sheets and blankets)?: A Little Difficulty moving from lying on back to  sitting on the side of the bed? : A Little Difficulty sitting down on and standing up from a chair with arms (e.g., wheelchair, bedside commode, etc,.)?: A Little Help needed moving to and from a bed to chair (including a wheelchair)?: A Little Help needed walking in hospital room?: A Little Help needed climbing 3-5 steps with a railing? : A Little 6 Click Score: 18    End of Session Equipment Utilized During Treatment: Gait belt Activity Tolerance: Patient tolerated treatment well Patient left: in chair;with call bell/phone within reach Nurse Communication: Mobility status PT Visit Diagnosis: Other abnormalities of gait and mobility (R26.89);Difficulty in walking, not elsewhere classified (R26.2)     Time: 4782-9562 PT Time Calculation (min) (ACUTE ONLY): 26 min  Charges:  $Gait Training: 8-22 mins $Therapeutic Exercise: 8-22 mins                     Felecia Shelling   PTA Acute  Rehabilitation Services Pager      [phone] Office      [phone]

## 2018-11-06 NOTE — Progress Notes (Signed)
Physical Therapy Treatment Patient Details Name: PIERSON VANTOL MRN: [id number] DOB: [birth year] Today's Date: 11/06/2018    History of Present Illness 72 yo male s/p R DA-THA on 11/05/18. PMH includes diverticulosis, peripheral neuropathy, obesity, HLD, HTN, L THA 2005.     PT Comments    POD # 1 pm session With family and pt performed stair training.  Instructed on proper tech and safe handling.  Instructed on HEP and use of ICE. Pt ready for D/C to home.   Follow Up Recommendations  Follow surgeon's recommendation for DC plan and follow-up therapies;Supervision for mobility/OOB(HEP)     Equipment Recommendations  None recommended by PT    Recommendations for Other Services       Precautions / Restrictions Precautions Precautions: Fall Restrictions Weight Bearing Restrictions: No Other Position/Activity Restrictions: WBAT     Mobility  Bed Mobility               General bed mobility comments: OOB in recliner   Transfers Overall transfer level: Needs assistance Equipment used: Rolling walker (2 wheeled) Transfers: Sit to/from Stand Sit to Stand: Supervision;Min guard         General transfer comment: 25% VC's on safety with turns and hand placement with stand to sit  Ambulation/Gait Ambulation/Gait assistance: Supervision;Min guard Gait Distance (Feet): 15 Feet Assistive device: Rolling walker (2 wheeled) Gait Pattern/deviations: Step-to pattern;Decreased stride length;Step-through pattern;Decreased weight shift to right;Decreased stance time - right;Antalgic Gait velocity: decr    General Gait Details: <25% VC's on proper walker to self distance and safety with turns decreased distance due to Tx focus on stairs    Stairs Stairs: Yes Stairs assistance: Supervision Stair Management: Two rails;Forwards Number of Stairs: 3 General stair comments: with spouse present and instruction on proper sequencing   Wheelchair Mobility    Modified  Rankin (Stroke Patients Only)       Balance                                            Cognition Arousal/Alertness: Awake/alert Behavior During Therapy: WFL for tasks assessed/performed Overall Cognitive Status: Within Functional Limits for tasks assessed                                        Exercises      General Comments        Pertinent Vitals/Pain Pain Assessment: No/denies pain Pain Score: 3  Pain Location: R hip  Pain Descriptors / Indicators: Sore Pain Intervention(s): Monitored during session;Repositioned;Ice applied    Home Living                      Prior Function            PT Goals (current goals can now be found in the care plan section) Progress towards PT goals: Progressing toward goals    Frequency    7X/week      PT Plan Current plan remains appropriate    Co-evaluation              AM-PAC PT "6 Clicks" Daily Activity  Outcome Measure  Difficulty turning over in bed (including adjusting bedclothes, sheets and blankets)?: A Little Difficulty moving from lying on back to sitting on the side of the bed? :  A Little Difficulty sitting down on and standing up from a chair with arms (e.g., wheelchair, bedside commode, etc,.)?: A Little Help needed moving to and from a bed to chair (including a wheelchair)?: A Little Help needed walking in hospital room?: A Little Help needed climbing 3-5 steps with a railing? : A Little 6 Click Score: 18    End of Session Equipment Utilized During Treatment: Gait belt Activity Tolerance: Patient tolerated treatment well Patient left: in chair;with call bell/phone within reach Nurse Communication: Mobility status PT Visit Diagnosis: Other abnormalities of gait and mobility (R26.89);Difficulty in walking, not elsewhere classified (R26.2)     Time: 1355-1410 PT Time Calculation (min) (ACUTE ONLY): 15 min  Charges:  $Gait Training: 8-22 mins                       Felecia ShellingLori Eveny Anastas  PTA Acute  Rehabilitation Services Pager      [phone] Office      [phone]
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
## 2019-04-02 ENCOUNTER — Encounter: Payer: Federal, State, Local not specified - PPO | Admitting: Family Medicine

## 2019-06-21 IMAGING — NM NM MISC PROCEDURE
3 series · 18 of 18 positions shown · non-contrast
Comparison: none

[Series 1: stress-sum-em_(id)_sa · 6.4mm · 6.40mm/px · 6 of 64 frames shown]
[frame 6/64]
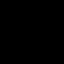
[frame 16/64]
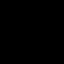
[frame 27/64]
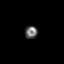
[frame 38/64]
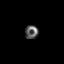
[frame 48/64]
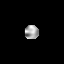
[frame 59/64]
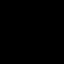

[Series 1: rest_(id)_sa · 6.4mm · 6.40mm/px · 6 of 64 frames shown]
[frame 6/64]
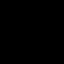
[frame 16/64]
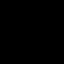
[frame 27/64]
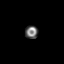
[frame 38/64]
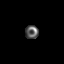
[frame 48/64]
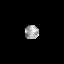
[frame 59/64]
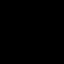

[Series 1: stress-gsp_(id)_sa · 6.4mm · 6.40mm/px · 6 of 512 frames shown]
[frame 43/512]
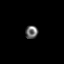
[frame 128/512]
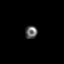
[frame 214/512]
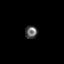
[frame 299/512]
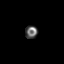
[frame 384/512]
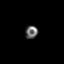
[frame 470/512]
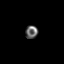

[18 of 18 positions shown; findings below may reference images not displayed]

Canned report from images found in remote index.

Refer to host system for actual result text.

## 2020-11-28 ENCOUNTER — Other Ambulatory Visit: Payer: Self-pay | Admitting: *Deleted

## 2020-11-28 DIAGNOSIS — Z87891 Personal history of nicotine dependence: Secondary | ICD-10-CM

## 2021-01-04 ENCOUNTER — Encounter: Payer: Self-pay | Admitting: Acute Care

## 2021-01-04 ENCOUNTER — Ambulatory Visit (INDEPENDENT_AMBULATORY_CARE_PROVIDER_SITE_OTHER): Payer: Federal, State, Local not specified - PPO | Admitting: Acute Care

## 2021-01-04 ENCOUNTER — Other Ambulatory Visit: Payer: Self-pay

## 2021-01-04 ENCOUNTER — Ambulatory Visit: Payer: Federal, State, Local not specified - PPO

## 2021-01-04 VITALS — BP 120/82 | HR 60 | Temp 98.1°F | Ht 65.0 in | Wt 226.0 lb

## 2021-01-04 DIAGNOSIS — Z87891 Personal history of nicotine dependence: Secondary | ICD-10-CM

## 2021-01-04 DIAGNOSIS — Z122 Encounter for screening for malignant neoplasm of respiratory organs: Secondary | ICD-10-CM

## 2021-01-04 NOTE — Patient Instructions (Signed)
Thank you for participating in the [facility]. It was our pleasure to meet you today. We will call you with the results of your scan within the next few days. Your scan will be assigned a Lung RADS category score by the physicians reading the scans.  This Lung RADS score determines follow up scanning.  See below for description of categories, and follow up screening recommendations. We will be in touch to schedule your follow up screening annually or based on recommendations of our providers. We will fax a copy of your scan results to your Primary Care Physician, or the physician who referred you to the program, to ensure they have the results. Please call the office if you have any questions or concerns regarding your scanning experience or results.  Our office number is [phone]. Please speak with Denise Phelps, RN. She is our Lung Cancer Screening RN. If she is unavailable when you call, please have the office staff send her a message. She will return your call at her earliest convenience. Remember, if your scan is normal, we will scan you annually as long as you continue to meet the criteria for the program. (Age 55-77, Current smoker or smoker who has quit within the last 15 years). If you are a smoker, remember, quitting is the single most powerful action that you can take to decrease your risk of lung cancer and other pulmonary, breathing related problems. We know quitting is hard, and we are here to help.  Please let us know if there is anything we can do to help you meet your goal of quitting. If you are a former smoker, congratulations. We are proud of you! Remain smoke free! Remember you can refer friends or family members through the number above.  We will screen them to make sure they meet criteria for the program. Thank you for helping us take better care of you by participating in Lung Screening.  Lung RADS Categories:  Lung RADS 1: no nodules  or definitely non-concerning nodules.  Recommendation is for a repeat annual scan in 12 months.  Lung RADS 2:  nodules that are non-concerning in appearance and behavior with a very low likelihood of becoming an active cancer. Recommendation is for a repeat annual scan in 12 months.  Lung RADS 3: nodules that are probably non-concerning , includes nodules with a low likelihood of becoming an active cancer.  Recommendation is for a 6-month repeat screening scan. Often noted after an upper respiratory illness. We will be in touch to make sure you have no questions, and to schedule your 6-month scan.  Lung RADS 4 A: nodules with concerning findings, recommendation is most often for a follow up scan in 3 months or additional testing based on our provider's assessment of the scan. We will be in touch to make sure you have no questions and to schedule the recommended 3 month follow up scan.  Lung RADS 4 B:  indicates findings that are concerning. We will be in touch with you to schedule additional diagnostic testing based on our provider's  assessment of the scan.   

## 2021-01-04 NOTE — Progress Notes (Addendum)
Shared Decision Making Visit Lung Cancer Screening Program [phone])   Eligibility:  Age 75 y.o.  Pack Years Smoking History Calculation 48 pack year smoking history (# packs/per year x # years smoked)  Recent History of coughing up blood  no  Unexplained weight loss? no ( >Than 15 pounds within the last 6 months )  Prior History Lung / other cancer no (Diagnosis within the last 5 years already requiring surveillance chest CT Scans).  Smoking Status Former Smoker  Former Smokers: Years since quit: 10 years  Quit Date: 2012  Visit Components:  Discussion included one or more decision making aids. yes  Discussion included risk/benefits of screening. yes  Discussion included potential follow up diagnostic testing for abnormal scans. yes  Discussion included meaning and risk of over diagnosis. yes  Discussion included meaning and risk of False Positives. yes  Discussion included meaning of total radiation exposure. yes  Counseling Included:  Importance of adherence to annual lung cancer LDCT screening. yes  Impact of comorbidities on ability to participate in the program. yes  Ability and willingness to under diagnostic treatment. yes  Smoking Cessation Counseling:  Current Smokers:   Discussed importance of smoking cessation. yes  Information about tobacco cessation classes and interventions provided to patient. yes  Patient provided with "ticket" for LDCT Scan. yes  Symptomatic Patient. no  Counseling NA  Diagnosis Code: Tobacco Use Z72.0  Asymptomatic Patient yes  Counseling (Intermediate counseling: > three minutes counseling) W2376  Former Smokers:   Discussed the importance of maintaining cigarette abstinence. 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 Yes  Patient provided with "ticket" for LDCT Scan. yes  Written Order for Lung  Cancer Screening with LDCT placed in Epic. Yes (CT Chest Lung Cancer Screening Low Dose W/O CM) [id number] Z12.2-Screening of respiratory organs Z87.891-Personal history of nicotine dependence  BP 120/82 (BP Location: Left Arm, Cuff Size: Normal)   Pulse 60   Temp 98.1 F (36.7 C) (Oral)   Ht 5\' 5"  (1.651 m)   Wt 226 lb (102.5 kg)   SpO2 97%   BMI 37.61 kg/m    I spent 25 minutes of face to face time with Ryan Kerr discussing the risks and benefits of lung cancer screening. We viewed a power point together that explained in detail the above noted topics. We took the time to pause the power point at intervals to allow for questions to be asked and answered to ensure understanding. We discussed that he had taken the single most powerful action possible to decrease his risk of developing lung cancer when he quit smoking. I counseled him to remain smoke free, and to contact me if he ever had the desire to smoke again so that I can provide resources and tools to help support the effort to remain smoke free. We discussed the time and location of the scan, and that either  Deirdre Evener RN or I will call with the results within  24-48 hours of receiving them. He has my card and contact information in the event he needs to speak with me, in addition to a copy of the power point we reviewed as a resource. He verbalized understanding of all of the above and had no further questions upon leaving the office.     I explained to the patient that there has been a high incidence of coronary artery disease noted on these exams. I explained that  this is a non-gated exam therefore degree or severity cannot be determined. This patient is currently on statin therapy. I have asked the patient to follow-up with their PCP regarding any incidental finding of coronary artery disease and management with diet or medication as they feel is clinically indicated. The patient verbalized understanding of the above and had no  further questions.  Pt. Meets criteria for Lung Cancer Screening.This is a preventative screening program.  He carries a high risk burden with his 80 pack year smoking history. He is a former smoker, quit 10 years ago.   Ryan Ngo, NP 01/04/2021

## 2021-01-09 ENCOUNTER — Telehealth: Payer: Self-pay | Admitting: Acute Care

## 2021-01-09 NOTE — Telephone Encounter (Signed)
I had spoke with Ryan Kerr right after [employer] denied his LCS CT. He was just now getting the denial letter.  I did fax Sarah's office note to [facility] on 01/05/21 and I confirmed that they did get the additional note on 01/05/21. The order was being pulled for the nurse reviewer to review.  I spoke with Ryan Kerr and made him aware that when I get the decision I would call him back

## 2021-01-11 NOTE — Telephone Encounter (Signed)
I finally got Ryan Kerr's LCS CT approved # [id number] valid 01/26/222 to 04/10/21 and it has been scheduled for 01/12/21 @ 4:30pm. He is aware of the appt and location

## 2021-01-12 ENCOUNTER — Ambulatory Visit (INDEPENDENT_AMBULATORY_CARE_PROVIDER_SITE_OTHER)
Admission: RE | Admit: 2021-01-12 | Discharge: 2021-01-12 | Disposition: A | Discharge status: Home / Self Care | Payer: Federal, State, Local not specified - PPO | Source: Ambulatory Visit | Attending: Acute Care | Admitting: Acute Care

## 2021-01-12 ENCOUNTER — Other Ambulatory Visit: Payer: Self-pay

## 2021-01-12 DIAGNOSIS — Z87891 Personal history of nicotine dependence: Secondary | ICD-10-CM | POA: Diagnosis not present

## 2021-01-24 NOTE — Progress Notes (Signed)
Please See below. Thanks

## 2021-01-24 NOTE — Progress Notes (Signed)
Please call patient and let them  know their  low dose Ct was read as a Lung RADS 2: nodules that are benign in appearance and behavior with a very low likelihood of becoming a clinically active cancer due to size or lack of growth. Recommendation per radiology is for a repeat LDCT in 12 months. .Please let them  know we will order and schedule their  annual screening scan for 12/2021. Please let them  know there was notation of CAD on their  scan.  Please remind the patient  that this is a non-gated exam therefore degree or severity of disease  cannot be determined. Please have them  follow up with their PCP regarding potential risk factor modification, dietary therapy or pharmacologic therapy if clinically indicated. Pt.  is  currently on statin therapy. Please place order for annual  screening scan for  12/2021 and fax results to PCP. Thanks so much.  Please let patient know there was notation of CAD. He is on statin and he is followed by cards. Thanks

## 2021-01-27 ENCOUNTER — Telehealth: Payer: Self-pay | Admitting: Acute Care

## 2021-01-27 DIAGNOSIS — Z87891 Personal history of nicotine dependence: Secondary | ICD-10-CM

## 2021-01-27 NOTE — Telephone Encounter (Signed)
Pt informed of CT results per Sarah Groce, NP.  PT verbalized understanding.  Copy sent to PCP.  Order placed for 1 yr f/u CT.  

## 2021-06-14 ENCOUNTER — Encounter: Payer: Self-pay | Admitting: *Deleted

## 2021-09-27 ENCOUNTER — Ambulatory Visit (INDEPENDENT_AMBULATORY_CARE_PROVIDER_SITE_OTHER): Payer: Federal, State, Local not specified - PPO | Admitting: Family Medicine

## 2021-09-27 ENCOUNTER — Other Ambulatory Visit: Payer: Self-pay

## 2021-09-27 DIAGNOSIS — Z23 Encounter for immunization: Secondary | ICD-10-CM | POA: Diagnosis not present

## 2022-01-17 ENCOUNTER — Other Ambulatory Visit: Payer: Self-pay

## 2022-01-17 ENCOUNTER — Ambulatory Visit (INDEPENDENT_AMBULATORY_CARE_PROVIDER_SITE_OTHER)
Admission: RE | Admit: 2022-01-17 | Discharge: 2022-01-17 | Disposition: A | Discharge status: Home / Self Care | Payer: Federal, State, Local not specified - PPO | Source: Ambulatory Visit | Attending: Acute Care | Admitting: Acute Care

## 2022-01-17 DIAGNOSIS — Z87891 Personal history of nicotine dependence: Secondary | ICD-10-CM

## 2022-01-19 ENCOUNTER — Other Ambulatory Visit: Payer: Self-pay

## 2022-01-19 DIAGNOSIS — Z87891 Personal history of nicotine dependence: Secondary | ICD-10-CM

## 2023-01-18 ENCOUNTER — Ambulatory Visit (HOSPITAL_COMMUNITY)
Admission: RE | Admit: 2023-01-18 | Discharge: 2023-01-18 | Disposition: A | Discharge status: Home / Self Care | Payer: Federal, State, Local not specified - PPO | Source: Ambulatory Visit | Attending: Acute Care | Admitting: Acute Care

## 2023-01-18 DIAGNOSIS — Z87891 Personal history of nicotine dependence: Secondary | ICD-10-CM | POA: Diagnosis present

## 2023-01-21 ENCOUNTER — Other Ambulatory Visit: Payer: Self-pay

## 2023-01-21 DIAGNOSIS — Z87891 Personal history of nicotine dependence: Secondary | ICD-10-CM

## 2023-01-21 DIAGNOSIS — Z122 Encounter for screening for malignant neoplasm of respiratory organs: Secondary | ICD-10-CM

## 2023-02-18 ENCOUNTER — Encounter: Payer: Self-pay | Admitting: Family Medicine

## 2023-02-18 ENCOUNTER — Ambulatory Visit (INDEPENDENT_AMBULATORY_CARE_PROVIDER_SITE_OTHER): Payer: Federal, State, Local not specified - PPO | Admitting: Family Medicine

## 2023-02-18 VITALS — BP 124/78 | HR 78 | Temp 98.1°F | Resp 18 | Ht 65.0 in | Wt 220.0 lb

## 2023-02-18 DIAGNOSIS — J069 Acute upper respiratory infection, unspecified: Secondary | ICD-10-CM

## 2023-02-18 MED ORDER — ALBUTEROL SULFATE HFA 108 (90 BASE) MCG/ACT IN AERS: 2.0000 | INHALATION_SPRAY | Freq: Four times a day (QID) | RESPIRATORY_TRACT | 0 refills | Status: DC | PRN

## 2023-02-18 MED ORDER — DOXYCYCLINE HYCLATE 100 MG PO TABS: 100.0000 mg | ORAL_TABLET | Freq: Two times a day (BID) | ORAL | 0 refills | Status: DC

## 2023-02-18 NOTE — Progress Notes (Signed)
   Subjective:    Patient ID: Ryan Kerr, male    DOB: 28-Jan-1946, 77 y.o.   MRN: [id number]  HPI 'sick'- pt reports sxs started ~1 week ago.  No fever.  Some body aches.  + sinus congestion.  Cough is productive.  No hx of lung disease.  Mild SOB.  + wheezing.     Review of Systems For ROS see HPI     Objective:   Physical Exam Vitals reviewed.  Constitutional:      General: He is not in acute distress.    Appearance: Normal appearance. He is well-developed. He is not ill-appearing.  HENT:     Head: Normocephalic and atraumatic.     Right Ear: Tympanic membrane normal.     Left Ear: Tympanic membrane normal.     Nose: Congestion present. No mucosal edema or rhinorrhea.     Right Sinus: No maxillary sinus tenderness or frontal sinus tenderness.     Left Sinus: No maxillary sinus tenderness or frontal sinus tenderness.     Comments: No TTP over frontal or maxillary sinuses    Mouth/Throat:     Pharynx: No oropharyngeal exudate or posterior oropharyngeal erythema.  Eyes:     Conjunctiva/sclera: Conjunctivae normal.     Pupils: Pupils are equal, round, and reactive to light.  Cardiovascular:     Rate and Rhythm: Normal rate and regular rhythm.     Heart sounds: Normal heart sounds.  Pulmonary:     Effort: Pulmonary effort is normal. No respiratory distress.     Breath sounds: No wheezing or rhonchi.     Comments: Decreased breath sounds throughout + hacking cough Musculoskeletal:     Cervical back: Normal range of motion and neck supple.  Lymphadenopathy:     Cervical: No cervical adenopathy.  Skin:    General: Skin is warm and dry.  Neurological:     General: No focal deficit present.     Mental Status: He is alert and oriented to person, place, and time.  Psychiatric:        Mood and Affect: Mood normal.        Behavior: Behavior normal.        Thought Content: Thought content normal.           Assessment & Plan:  URI- new.  No hx of lung disease.  Wife  w/ similar sxs.  Start doxycycline as sxs have been going on for at least a week and not improving.  Add Albuterol prn wheezing.  Reviewed supportive care and red flags that should prompt return.  Pt expressed understanding and is in agreement w/ plan.

## 2023-02-18 NOTE — Patient Instructions (Signed)
Follow up as needed or as scheduled START the Doxycycline twice daily w/ food USE the Albuterol inhaler- 2 puffs- as needed for cough, wheezing, or shortness of breath Drink LOTS of fluids REST! OTC Robitussin or Delsym to help w/ cough Call with any questions or concerns Hang in there!!!

## 2023-03-17 ENCOUNTER — Other Ambulatory Visit: Payer: Self-pay | Admitting: Family Medicine

## 2024-01-20 ENCOUNTER — Encounter (HOSPITAL_COMMUNITY): Payer: Self-pay

## 2024-01-20 ENCOUNTER — Ambulatory Visit (HOSPITAL_COMMUNITY): Payer: BC Managed Care – PPO

## 2024-01-20 ENCOUNTER — Telehealth: Payer: Self-pay | Admitting: Acute Care

## 2024-01-20 NOTE — Telephone Encounter (Signed)
Patient had his annual LDCT scheduled for today 01/20/24 but cancelled. We were informed by radiology that pt cancelled appt due his wife aging out of the program (being 78yo and older) and he didn't want to have anymore scans if she couldn't. I called patient to inform him he will only be able to have one more scan before he will also age out. I informed patient of the importance of screening and early detection for lung cancer. Patient was understanding but refused any additional screening. I advised pt to call [place] back before his birthday if he decides to change his mind. Pt verbalized understanding and denied any further questions or concerns at this time.

## 2024-07-20 ENCOUNTER — Ambulatory Visit (INDEPENDENT_AMBULATORY_CARE_PROVIDER_SITE_OTHER): Admitting: Family Medicine

## 2024-07-20 ENCOUNTER — Encounter: Payer: Self-pay | Admitting: Family Medicine

## 2024-07-20 VITALS — BP 132/62 | HR 65 | Temp 98.2°F | Wt 222.4 lb

## 2024-07-20 DIAGNOSIS — J029 Acute pharyngitis, unspecified: Secondary | ICD-10-CM

## 2024-07-20 LAB — POC COVID19 BINAXNOW: SARS Coronavirus 2 Ag: NEGATIVE

## 2024-07-20 LAB — POCT RAPID STREP A (OFFICE): Rapid Strep A Screen: NEGATIVE

## 2024-07-20 MED ORDER — AMOXICILLIN 875 MG PO TABS: 875.0000 mg | ORAL_TABLET | Freq: Two times a day (BID) | ORAL | 0 refills | Status: AC

## 2024-07-20 NOTE — Progress Notes (Signed)
   Subjective:    Patient ID: Ryan Kerr, male    DOB: [birth year], 77 y.o.   MRN: [id number]  HPI Sore throat- sxs started Friday night.  Pt has a known grass sensitivity and mowed on Friday.  Noticed white spots on back of throat on Saturday.  No fever.  Painful to swallow.  + chills this morning.  + LAD.  Developed HA this morning.     Review of Systems For ROS see HPI     Objective:   Physical Exam Vitals reviewed.  Constitutional:      General: He is not in acute distress.    Appearance: He is well-developed. He is not ill-appearing.  HENT:     Head: Normocephalic and atraumatic.     Right Ear: Tympanic membrane and ear canal normal.     Left Ear: Tympanic membrane and ear canal normal.     Mouth/Throat:     Mouth: Mucous membranes are moist.     Pharynx: Oropharyngeal exudate and posterior oropharyngeal erythema present.  Eyes:     General: No scleral icterus.    Pupils: Pupils are equal, round, and reactive to light.  Cardiovascular:     Rate and Rhythm: Normal rate and regular rhythm.  Pulmonary:     Effort: Pulmonary effort is normal. No respiratory distress.     Breath sounds: Normal breath sounds. No wheezing or rhonchi.  Musculoskeletal:     Cervical back: Neck supple.  Lymphadenopathy:     Cervical: No cervical adenopathy.  Skin:    General: Skin is warm and dry.  Neurological:     General: No focal deficit present.     Mental Status: He is alert and oriented to person, place, and time.  Psychiatric:        Mood and Affect: Mood normal.        Behavior: Behavior normal.           Assessment & Plan:  Sore throat- new.  Sxs started 3 days ago and have worsened w/ increased pain and now chills and headache.  Throat is very red w/ white patches.  Despite negative strep test, will tx w/ Amoxicillin .  Reviewed supportive care and red flags that should prompt return.  Pt expressed understanding and is in agreement w/ plan.

## 2024-07-20 NOTE — Patient Instructions (Addendum)
 Follow up as needed or as scheduled START the Amoxicillin  twice daily w/ food Drink LOTS of fluids to help w/ your sore throat Tylenol  or Ibuprofen as needed for pain REST! Call with any questions or concerns Hang in there!!
